# Patient Record
Sex: Female | Born: 1965
Health system: Southern US, Community
[De-identification: ages and names within clinical notes are randomized; demographics above are authoritative.]

## PROBLEM LIST (undated history)

## (undated) ENCOUNTER — Emergency Department (HOSPITAL_COMMUNITY): Payer: Medicare Other

## (undated) DIAGNOSIS — M797 Fibromyalgia: Secondary | ICD-10-CM

## (undated) DIAGNOSIS — E785 Hyperlipidemia, unspecified: Secondary | ICD-10-CM

## (undated) DIAGNOSIS — I714 Abdominal aortic aneurysm, without rupture, unspecified: Secondary | ICD-10-CM

## (undated) DIAGNOSIS — F32A Depression, unspecified: Secondary | ICD-10-CM

## (undated) DIAGNOSIS — I2510 Atherosclerotic heart disease of native coronary artery without angina pectoris: Secondary | ICD-10-CM

## (undated) DIAGNOSIS — N200 Calculus of kidney: Secondary | ICD-10-CM

## (undated) DIAGNOSIS — K219 Gastro-esophageal reflux disease without esophagitis: Secondary | ICD-10-CM

## (undated) DIAGNOSIS — K449 Diaphragmatic hernia without obstruction or gangrene: Secondary | ICD-10-CM

## (undated) DIAGNOSIS — I10 Essential (primary) hypertension: Secondary | ICD-10-CM

## (undated) DIAGNOSIS — F329 Major depressive disorder, single episode, unspecified: Secondary | ICD-10-CM

## (undated) HISTORY — DX: Hyperlipidemia, unspecified: E78.5

## (undated) HISTORY — DX: Depression, unspecified: F32.A

## (undated) HISTORY — DX: Abdominal aortic aneurysm, without rupture: I71.4

## (undated) HISTORY — DX: Major depressive disorder, single episode, unspecified: F32.9

## (undated) HISTORY — DX: Diaphragmatic hernia without obstruction or gangrene: K44.9

## (undated) HISTORY — DX: Gastro-esophageal reflux disease without esophagitis: K21.9

## (undated) HISTORY — DX: Calculus of kidney: N20.0

## (undated) HISTORY — DX: Essential (primary) hypertension: I10

## (undated) HISTORY — DX: Fibromyalgia: M79.7

## (undated) HISTORY — DX: Atherosclerotic heart disease of native coronary artery without angina pectoris: I25.10

## (undated) HISTORY — DX: Abdominal aortic aneurysm, without rupture, unspecified: I71.40

---

## 2004-12-16 ENCOUNTER — Emergency Department: Payer: Self-pay | Admitting: Emergency Medicine

## 2005-01-04 ENCOUNTER — Emergency Department: Payer: Self-pay | Admitting: Emergency Medicine

## 2005-02-25 ENCOUNTER — Inpatient Hospital Stay: Payer: Self-pay | Admitting: Internal Medicine

## 2005-07-26 ENCOUNTER — Inpatient Hospital Stay: Payer: Self-pay | Admitting: Infectious Diseases

## 2005-08-24 ENCOUNTER — Emergency Department: Payer: Self-pay | Admitting: Emergency Medicine

## 2005-08-26 ENCOUNTER — Emergency Department: Payer: Self-pay | Admitting: Emergency Medicine

## 2005-08-29 ENCOUNTER — Emergency Department: Payer: Self-pay | Admitting: Emergency Medicine

## 2005-10-01 ENCOUNTER — Emergency Department: Payer: Self-pay | Admitting: Emergency Medicine

## 2005-10-10 ENCOUNTER — Other Ambulatory Visit: Payer: Self-pay

## 2005-10-10 ENCOUNTER — Inpatient Hospital Stay: Payer: Self-pay | Admitting: Internal Medicine

## 2005-12-04 ENCOUNTER — Other Ambulatory Visit: Payer: Self-pay

## 2005-12-04 ENCOUNTER — Inpatient Hospital Stay: Payer: Self-pay | Admitting: Internal Medicine

## 2005-12-28 ENCOUNTER — Emergency Department: Payer: Self-pay | Admitting: Emergency Medicine

## 2006-01-06 ENCOUNTER — Inpatient Hospital Stay: Payer: Self-pay | Admitting: Internal Medicine

## 2006-01-06 ENCOUNTER — Other Ambulatory Visit: Payer: Self-pay

## 2006-01-07 ENCOUNTER — Other Ambulatory Visit: Payer: Self-pay

## 2006-01-08 ENCOUNTER — Other Ambulatory Visit: Payer: Self-pay

## 2006-03-19 ENCOUNTER — Emergency Department: Payer: Self-pay | Admitting: Emergency Medicine

## 2006-05-07 ENCOUNTER — Emergency Department: Payer: Self-pay | Admitting: Emergency Medicine

## 2006-05-08 ENCOUNTER — Emergency Department: Payer: Self-pay | Admitting: Unknown Physician Specialty

## 2006-08-03 ENCOUNTER — Emergency Department: Payer: Self-pay | Admitting: Emergency Medicine

## 2007-01-17 ENCOUNTER — Emergency Department: Payer: Self-pay | Admitting: Emergency Medicine

## 2007-04-17 ENCOUNTER — Emergency Department: Payer: Self-pay | Admitting: Emergency Medicine

## 2007-08-11 ENCOUNTER — Emergency Department: Payer: Self-pay | Admitting: Emergency Medicine

## 2007-09-04 ENCOUNTER — Emergency Department: Payer: Self-pay | Admitting: Emergency Medicine

## 2008-07-13 ENCOUNTER — Other Ambulatory Visit: Payer: Self-pay

## 2008-07-14 ENCOUNTER — Inpatient Hospital Stay: Payer: Self-pay | Admitting: Internal Medicine

## 2008-08-07 ENCOUNTER — Emergency Department: Payer: Self-pay | Admitting: Emergency Medicine

## 2008-11-15 ENCOUNTER — Emergency Department: Payer: Self-pay | Admitting: Emergency Medicine

## 2008-12-21 ENCOUNTER — Emergency Department: Payer: Self-pay

## 2008-12-25 ENCOUNTER — Emergency Department: Payer: Self-pay | Admitting: Emergency Medicine

## 2009-02-21 ENCOUNTER — Emergency Department: Payer: Self-pay | Admitting: Emergency Medicine

## 2009-03-25 ENCOUNTER — Emergency Department: Payer: Self-pay | Admitting: Emergency Medicine

## 2009-04-13 ENCOUNTER — Emergency Department: Payer: Self-pay | Admitting: Emergency Medicine

## 2009-05-31 ENCOUNTER — Ambulatory Visit: Payer: Self-pay | Admitting: Specialist

## 2009-07-15 ENCOUNTER — Emergency Department: Payer: Self-pay | Admitting: Emergency Medicine

## 2009-07-21 ENCOUNTER — Emergency Department: Payer: Self-pay | Admitting: Emergency Medicine

## 2009-12-08 ENCOUNTER — Emergency Department: Payer: Self-pay | Admitting: Emergency Medicine

## 2010-01-06 ENCOUNTER — Emergency Department: Payer: Self-pay | Admitting: Emergency Medicine

## 2010-05-25 ENCOUNTER — Emergency Department: Payer: Self-pay | Admitting: Emergency Medicine

## 2010-06-21 ENCOUNTER — Emergency Department: Payer: Self-pay | Admitting: Emergency Medicine

## 2010-06-28 ENCOUNTER — Inpatient Hospital Stay: Payer: Self-pay | Admitting: Internal Medicine

## 2010-07-28 ENCOUNTER — Emergency Department: Payer: Self-pay | Admitting: Emergency Medicine

## 2010-08-26 ENCOUNTER — Emergency Department: Payer: Self-pay | Admitting: Internal Medicine

## 2010-10-25 ENCOUNTER — Emergency Department: Payer: Self-pay | Admitting: Emergency Medicine

## 2010-11-13 ENCOUNTER — Emergency Department: Payer: Self-pay | Admitting: Emergency Medicine

## 2010-12-05 ENCOUNTER — Inpatient Hospital Stay: Payer: Self-pay | Admitting: Internal Medicine

## 2011-02-22 ENCOUNTER — Emergency Department: Payer: Self-pay | Admitting: Emergency Medicine

## 2011-05-31 ENCOUNTER — Inpatient Hospital Stay: Payer: Self-pay | Admitting: Internal Medicine

## 2011-07-13 ENCOUNTER — Emergency Department: Payer: Self-pay | Admitting: Emergency Medicine

## 2011-08-02 ENCOUNTER — Emergency Department: Payer: Self-pay | Admitting: *Deleted

## 2011-08-15 ENCOUNTER — Emergency Department: Payer: Self-pay | Admitting: Emergency Medicine

## 2011-09-15 ENCOUNTER — Emergency Department: Payer: Self-pay | Admitting: Emergency Medicine

## 2011-11-13 ENCOUNTER — Emergency Department: Payer: Self-pay | Admitting: Emergency Medicine

## 2011-11-14 ENCOUNTER — Emergency Department: Payer: Self-pay | Admitting: Emergency Medicine

## 2011-11-23 ENCOUNTER — Emergency Department: Payer: Self-pay | Admitting: Emergency Medicine

## 2011-11-23 LAB — URINALYSIS, COMPLETE
Bacteria: NONE SEEN
Glucose,UR: NEGATIVE mg/dL (ref 0–75)
Ketone: NEGATIVE
Leukocyte Esterase: NEGATIVE
Specific Gravity: 1.006 (ref 1.003–1.030)
WBC UR: <1 /HPF (ref 0–5)

## 2011-11-23 LAB — COMPREHENSIVE METABOLIC PANEL
Alkaline Phosphatase: 73 U/L (ref 50–136)
Bilirubin,Total: 0.2 mg/dL (ref 0.2–1.0)
Calcium, Total: 9.2 mg/dL (ref 8.5–10.1)
Chloride: 107 mmol/L (ref 98–107)
Co2: 25 mmol/L (ref 21–32)
Creatinine: 0.74 mg/dL (ref 0.60–1.30)
EGFR (African American): >60
EGFR (Non-African Amer.): >60
SGOT(AST): 25 U/L (ref 15–37)
SGPT (ALT): 21 U/L
Sodium: 141 mmol/L (ref 136–145)

## 2011-11-23 LAB — CBC
MCH: 29.1 pg (ref 26.0–34.0)
MCHC: 33.5 g/dL (ref 32.0–36.0)
MCV: 87 fL (ref 80–100)
Platelet: 286 10*3/uL (ref 150–440)
RBC: 5.16 10*6/uL (ref 3.80–5.20)

## 2011-11-23 LAB — LIPASE, BLOOD: Lipase: 118 U/L (ref 73–393)

## 2011-11-23 LAB — PREGNANCY, URINE: Pregnancy Test, Urine: NEGATIVE m[IU]/mL

## 2011-12-11 ENCOUNTER — Ambulatory Visit: Payer: Self-pay | Admitting: Urology

## 2011-12-18 ENCOUNTER — Ambulatory Visit: Payer: Self-pay | Admitting: Urology

## 2011-12-20 ENCOUNTER — Ambulatory Visit: Payer: Self-pay | Admitting: Urology

## 2012-01-11 ENCOUNTER — Emergency Department: Payer: Self-pay | Admitting: Emergency Medicine

## 2012-02-18 ENCOUNTER — Emergency Department: Payer: Self-pay | Admitting: Emergency Medicine

## 2012-02-18 LAB — CBC
HCT: 40.4 % (ref 35.0–47.0)
MCHC: 33.3 g/dL (ref 32.0–36.0)
MCV: 88 fL (ref 80–100)
Platelet: 276 10*3/uL (ref 150–440)
RBC: 4.59 10*6/uL (ref 3.80–5.20)

## 2012-02-18 LAB — COMPREHENSIVE METABOLIC PANEL
Albumin: 4 g/dL (ref 3.4–5.0)
Alkaline Phosphatase: 81 U/L (ref 50–136)
Anion Gap: 10 (ref 7–16)
BUN: 15 mg/dL (ref 7–18)
Bilirubin,Total: 0.3 mg/dL (ref 0.2–1.0)
Calcium, Total: 9.2 mg/dL (ref 8.5–10.1)
Chloride: 108 mmol/L — ABNORMAL HIGH (ref 98–107)
EGFR (African American): >60
EGFR (Non-African Amer.): >60
Glucose: 91 mg/dL (ref 65–99)
Osmolality: 284 (ref 275–301)
SGOT(AST): 19 U/L (ref 15–37)
SGPT (ALT): 7 U/L — ABNORMAL LOW
Sodium: 142 mmol/L (ref 136–145)
Total Protein: 7.4 g/dL (ref 6.4–8.2)

## 2012-02-18 LAB — LIPASE, BLOOD: Lipase: 145 U/L (ref 73–393)

## 2012-02-18 LAB — URINALYSIS, COMPLETE
Bilirubin,UR: NEGATIVE
Glucose,UR: NEGATIVE mg/dL (ref 0–75)
Leukocyte Esterase: NEGATIVE
Nitrite: NEGATIVE
Ph: 5 (ref 4.5–8.0)
Protein: NEGATIVE
Squamous Epithelial: 3
WBC UR: 3 /HPF (ref 0–5)

## 2012-03-19 ENCOUNTER — Observation Stay: Payer: Self-pay | Admitting: Internal Medicine

## 2012-03-19 LAB — COMPREHENSIVE METABOLIC PANEL
Albumin: 4.2 g/dL (ref 3.4–5.0)
Anion Gap: 7 (ref 7–16)
BUN: 6 mg/dL — ABNORMAL LOW (ref 7–18)
Bilirubin,Total: 0.2 mg/dL (ref 0.2–1.0)
Calcium, Total: 9.1 mg/dL (ref 8.5–10.1)
Chloride: 111 mmol/L — ABNORMAL HIGH (ref 98–107)
Co2: 22 mmol/L (ref 21–32)
Creatinine: 0.58 mg/dL — ABNORMAL LOW (ref 0.60–1.30)
EGFR (African American): >60
Glucose: 97 mg/dL (ref 65–99)
Osmolality: 277 (ref 275–301)
Potassium: 3.8 mmol/L (ref 3.5–5.1)
SGOT(AST): 18 U/L (ref 15–37)
SGPT (ALT): 16 U/L

## 2012-03-19 LAB — CK TOTAL AND CKMB (NOT AT ARMC)
CK, Total: 84 U/L (ref 21–215)
CK-MB: 0.8 ng/mL (ref 0.5–3.6)

## 2012-03-19 LAB — CBC
MCV: 88 fL (ref 80–100)
RBC: 5.06 10*6/uL (ref 3.80–5.20)
RDW: 16.3 % — ABNORMAL HIGH (ref 11.5–14.5)

## 2012-03-19 LAB — TROPONIN I: Troponin-I: <0.02 ng/mL

## 2012-03-19 LAB — CK-MB: CK-MB: <0.5 ng/mL — ABNORMAL LOW (ref 0.5–3.6)

## 2012-03-20 LAB — BASIC METABOLIC PANEL
BUN: 10 mg/dL (ref 7–18)
Calcium, Total: 9 mg/dL (ref 8.5–10.1)
Chloride: 107 mmol/L (ref 98–107)
Creatinine: 0.86 mg/dL (ref 0.60–1.30)
EGFR (Non-African Amer.): >60
Glucose: 89 mg/dL (ref 65–99)
Osmolality: 274 (ref 275–301)
Sodium: 138 mmol/L (ref 136–145)

## 2012-03-20 LAB — CBC WITH DIFFERENTIAL/PLATELET
Basophil #: 0.1 10*3/uL (ref 0.0–0.1)
Lymphocyte %: 46 %
Monocyte #: 0.5 x10 3/mm (ref 0.2–0.9)
Neutrophil #: 2.8 10*3/uL (ref 1.4–6.5)
Platelet: 180 10*3/uL (ref 150–440)
RDW: 15.8 % — ABNORMAL HIGH (ref 11.5–14.5)
WBC: 6.7 10*3/uL (ref 3.6–11.0)

## 2012-03-20 LAB — TROPONIN I: Troponin-I: <0.02 ng/mL

## 2012-04-03 ENCOUNTER — Emergency Department: Payer: Self-pay | Admitting: *Deleted

## 2012-04-03 LAB — CBC
HCT: 40.4 % (ref 35.0–47.0)
HGB: 13 g/dL (ref 12.0–16.0)
MCH: 28.7 pg (ref 26.0–34.0)
MCHC: 32.2 g/dL (ref 32.0–36.0)
MCV: 89 fL (ref 80–100)
Platelet: 265 10*3/uL (ref 150–440)
RBC: 4.54 10*6/uL (ref 3.80–5.20)
RDW: 15.4 % — ABNORMAL HIGH (ref 11.5–14.5)

## 2012-04-03 LAB — URINALYSIS, COMPLETE
Bilirubin,UR: NEGATIVE
Glucose,UR: NEGATIVE mg/dL (ref 0–75)
Nitrite: NEGATIVE
Protein: NEGATIVE
Specific Gravity: 1.01 (ref 1.003–1.030)
Squamous Epithelial: 1

## 2012-04-03 LAB — COMPREHENSIVE METABOLIC PANEL
Albumin: 3.9 g/dL (ref 3.4–5.0)
BUN: 11 mg/dL (ref 7–18)
Bilirubin,Total: 0.3 mg/dL (ref 0.2–1.0)
Calcium, Total: 8.9 mg/dL (ref 8.5–10.1)
Co2: 26 mmol/L (ref 21–32)
EGFR (African American): >60
EGFR (Non-African Amer.): >60
Glucose: 89 mg/dL (ref 65–99)
Potassium: 3.9 mmol/L (ref 3.5–5.1)
Total Protein: 7.3 g/dL (ref 6.4–8.2)

## 2012-04-03 LAB — LIPASE, BLOOD: Lipase: 160 U/L (ref 73–393)

## 2012-05-02 ENCOUNTER — Emergency Department: Payer: Self-pay | Admitting: Emergency Medicine

## 2012-05-02 LAB — COMPREHENSIVE METABOLIC PANEL
Albumin: 4.4 g/dL (ref 3.4–5.0)
Anion Gap: 8 (ref 7–16)
BUN: 5 mg/dL — ABNORMAL LOW (ref 7–18)
Bilirubin,Total: 0.4 mg/dL (ref 0.2–1.0)
Calcium, Total: 9.8 mg/dL (ref 8.5–10.1)
EGFR (Non-African Amer.): >60
Osmolality: 273 (ref 275–301)
Potassium: 3.5 mmol/L (ref 3.5–5.1)
SGOT(AST): 17 U/L (ref 15–37)
Sodium: 138 mmol/L (ref 136–145)
Total Protein: 8.1 g/dL (ref 6.4–8.2)

## 2012-05-02 LAB — CBC
HCT: 45.5 % (ref 35.0–47.0)
MCV: 87 fL (ref 80–100)
Platelet: 291 10*3/uL (ref 150–440)
RBC: 5.25 10*6/uL — ABNORMAL HIGH (ref 3.80–5.20)
RDW: 15.5 % — ABNORMAL HIGH (ref 11.5–14.5)
WBC: 16.4 10*3/uL — ABNORMAL HIGH (ref 3.6–11.0)

## 2012-05-02 LAB — URINALYSIS, COMPLETE
Leukocyte Esterase: NEGATIVE
Nitrite: NEGATIVE
Ph: 6 (ref 4.5–8.0)
RBC,UR: 4 /HPF (ref 0–5)
Specific Gravity: 1.005 (ref 1.003–1.030)

## 2012-05-10 ENCOUNTER — Emergency Department: Payer: Self-pay | Admitting: Emergency Medicine

## 2012-06-10 LAB — COMPREHENSIVE METABOLIC PANEL
Albumin: 4.1 g/dL (ref 3.4–5.0)
Alkaline Phosphatase: 93 U/L (ref 50–136)
BUN: 6 mg/dL — ABNORMAL LOW (ref 7–18)
Bilirubin,Total: 0.3 mg/dL (ref 0.2–1.0)
Calcium, Total: 9.6 mg/dL (ref 8.5–10.1)
Creatinine: 0.71 mg/dL (ref 0.60–1.30)
EGFR (African American): >60
Glucose: 96 mg/dL (ref 65–99)
Osmolality: 271 (ref 275–301)
Potassium: 3.4 mmol/L — ABNORMAL LOW (ref 3.5–5.1)
Sodium: 137 mmol/L (ref 136–145)
Total Protein: 7.8 g/dL (ref 6.4–8.2)

## 2012-06-10 LAB — URINALYSIS, COMPLETE
Glucose,UR: NEGATIVE mg/dL (ref 0–75)
Leukocyte Esterase: NEGATIVE
Nitrite: NEGATIVE
Ph: 6 (ref 4.5–8.0)
Protein: NEGATIVE
Specific Gravity: 1.005 (ref 1.003–1.030)

## 2012-06-10 LAB — CBC
HCT: 45.9 % (ref 35.0–47.0)
MCHC: 31.4 g/dL — ABNORMAL LOW (ref 32.0–36.0)
Platelet: 348 10*3/uL (ref 150–440)
RBC: 5.28 10*6/uL — ABNORMAL HIGH (ref 3.80–5.20)
WBC: 14.3 10*3/uL — ABNORMAL HIGH (ref 3.6–11.0)

## 2012-06-11 ENCOUNTER — Observation Stay: Payer: Self-pay | Admitting: Internal Medicine

## 2012-06-11 LAB — TROPONIN I
Troponin-I: <0.02 ng/mL
Troponin-I: <0.02 ng/mL

## 2012-06-11 LAB — CLOSTRIDIUM DIFFICILE BY PCR

## 2012-06-17 LAB — CULTURE, BLOOD (SINGLE)

## 2012-06-26 ENCOUNTER — Emergency Department: Payer: Self-pay | Admitting: Emergency Medicine

## 2012-06-26 LAB — COMPREHENSIVE METABOLIC PANEL
Albumin: 4.2 g/dL (ref 3.4–5.0)
Alkaline Phosphatase: 92 U/L (ref 50–136)
BUN: 5 mg/dL — ABNORMAL LOW (ref 7–18)
Bilirubin,Total: 0.3 mg/dL (ref 0.2–1.0)
Chloride: 109 mmol/L — ABNORMAL HIGH (ref 98–107)
Creatinine: 0.71 mg/dL (ref 0.60–1.30)
EGFR (African American): >60
EGFR (Non-African Amer.): >60
Glucose: 94 mg/dL (ref 65–99)
Potassium: 3.9 mmol/L (ref 3.5–5.1)
SGOT(AST): 20 U/L (ref 15–37)
SGPT (ALT): 11 U/L — ABNORMAL LOW (ref 12–78)
Sodium: 140 mmol/L (ref 136–145)
Total Protein: 8.3 g/dL — ABNORMAL HIGH (ref 6.4–8.2)

## 2012-06-26 LAB — URINALYSIS, COMPLETE
Bacteria: NONE SEEN
Bilirubin,UR: NEGATIVE
Blood: NEGATIVE
Glucose,UR: NEGATIVE mg/dL (ref 0–75)
Nitrite: NEGATIVE
Protein: NEGATIVE
Specific Gravity: 1.012 (ref 1.003–1.030)
Squamous Epithelial: 2
Transitional Epi: <1
WBC UR: 1 /HPF (ref 0–5)

## 2012-06-26 LAB — CBC
HCT: 47 % (ref 35.0–47.0)
MCHC: 33 g/dL (ref 32.0–36.0)
Platelet: 302 10*3/uL (ref 150–440)
RDW: 16.9 % — ABNORMAL HIGH (ref 11.5–14.5)
WBC: 10.1 10*3/uL (ref 3.6–11.0)

## 2012-06-26 LAB — LIPASE, BLOOD: Lipase: 111 U/L (ref 73–393)

## 2012-07-09 ENCOUNTER — Observation Stay: Payer: Self-pay | Admitting: Internal Medicine

## 2012-07-09 LAB — TROPONIN I
Troponin-I: <0.02 ng/mL
Troponin-I: <0.02 ng/mL

## 2012-07-09 LAB — CK TOTAL AND CKMB (NOT AT ARMC)
CK, Total: 74 U/L (ref 21–215)
CK-MB: 0.7 ng/mL (ref 0.5–3.6)
CK-MB: 0.5 ng/mL (ref 0.5–3.6)

## 2012-07-09 LAB — BASIC METABOLIC PANEL
Anion Gap: 6 — ABNORMAL LOW (ref 7–16)
Calcium, Total: 9.2 mg/dL (ref 8.5–10.1)
Chloride: 108 mmol/L — ABNORMAL HIGH (ref 98–107)
Creatinine: 0.62 mg/dL (ref 0.60–1.30)
EGFR (African American): >60
EGFR (Non-African Amer.): >60
Glucose: 90 mg/dL (ref 65–99)
Potassium: 3.8 mmol/L (ref 3.5–5.1)
Sodium: 139 mmol/L (ref 136–145)

## 2012-07-09 LAB — CBC
HGB: 13.6 g/dL (ref 12.0–16.0)
MCHC: 32.6 g/dL (ref 32.0–36.0)
RBC: 4.79 10*6/uL (ref 3.80–5.20)
RDW: 17.2 % — ABNORMAL HIGH (ref 11.5–14.5)
WBC: 12 10*3/uL — ABNORMAL HIGH (ref 3.6–11.0)

## 2012-07-10 LAB — CK TOTAL AND CKMB (NOT AT ARMC)
CK, Total: 48 U/L (ref 21–215)
CK-MB: 0.5 ng/mL (ref 0.5–3.6)

## 2012-08-04 ENCOUNTER — Emergency Department: Payer: Self-pay | Admitting: Internal Medicine

## 2012-08-24 ENCOUNTER — Emergency Department: Payer: Self-pay | Admitting: Emergency Medicine

## 2012-10-04 ENCOUNTER — Other Ambulatory Visit: Payer: Self-pay | Admitting: Pain Medicine

## 2012-10-04 ENCOUNTER — Ambulatory Visit: Payer: Self-pay | Admitting: Pain Medicine

## 2012-10-04 LAB — SEDIMENTATION RATE: Erythrocyte Sed Rate: 2 mm/hr (ref 0–20)

## 2012-10-30 ENCOUNTER — Emergency Department: Payer: Self-pay | Admitting: Internal Medicine

## 2012-10-30 LAB — HEPATIC FUNCTION PANEL A (ARMC)
Albumin: 3.9 g/dL (ref 3.4–5.0)
Bilirubin, Direct: <0.1 mg/dL (ref 0.00–0.20)
Bilirubin,Total: 0.4 mg/dL (ref 0.2–1.0)
SGOT(AST): 16 U/L (ref 15–37)
Total Protein: 7.8 g/dL (ref 6.4–8.2)

## 2012-10-30 LAB — CBC
HCT: 43.2 % (ref 35.0–47.0)
HGB: 13.9 g/dL (ref 12.0–16.0)
MCHC: 32.3 g/dL (ref 32.0–36.0)
Platelet: 301 10*3/uL (ref 150–440)
RBC: 4.9 10*6/uL (ref 3.80–5.20)

## 2012-10-30 LAB — BASIC METABOLIC PANEL
Calcium, Total: 9.1 mg/dL (ref 8.5–10.1)
Chloride: 107 mmol/L (ref 98–107)
Co2: 21 mmol/L (ref 21–32)
Glucose: 96 mg/dL (ref 65–99)
Osmolality: 271 (ref 275–301)
Potassium: 3.6 mmol/L (ref 3.5–5.1)
Sodium: 137 mmol/L (ref 136–145)

## 2012-10-30 LAB — LIPASE, BLOOD: Lipase: 85 U/L (ref 73–393)

## 2012-10-30 LAB — CK TOTAL AND CKMB (NOT AT ARMC)
CK, Total: 72 U/L (ref 21–215)
CK-MB: 0.5 ng/mL (ref 0.5–3.6)

## 2012-11-13 HISTORY — PX: CARDIAC CATHETERIZATION: SHX172

## 2012-11-18 ENCOUNTER — Ambulatory Visit: Payer: Self-pay | Admitting: Pain Medicine

## 2012-11-22 ENCOUNTER — Observation Stay: Payer: Self-pay | Admitting: Internal Medicine

## 2012-11-22 LAB — CBC
HGB: 14.6 g/dL (ref 12.0–16.0)
MCHC: 31.8 g/dL — ABNORMAL LOW (ref 32.0–36.0)
Platelet: 230 10*3/uL (ref 150–440)
RBC: 5.2 10*6/uL (ref 3.80–5.20)
RDW: 16.8 % — ABNORMAL HIGH (ref 11.5–14.5)
WBC: 10.6 10*3/uL (ref 3.6–11.0)

## 2012-11-22 LAB — DRUG SCREEN, URINE
Amphetamines, Ur Screen: NEGATIVE (ref ?–1000)
Barbiturates, Ur Screen: NEGATIVE (ref ?–200)
Benzodiazepine, Ur Scrn: NEGATIVE (ref ?–200)
Cannabinoid 50 Ng, Ur ~~LOC~~: NEGATIVE (ref ?–50)
Cocaine Metabolite,Ur ~~LOC~~: NEGATIVE (ref ?–300)
MDMA (Ecstasy)Ur Screen: NEGATIVE (ref ?–500)
Phencyclidine (PCP) Ur S: NEGATIVE (ref ?–25)

## 2012-11-22 LAB — COMPREHENSIVE METABOLIC PANEL
Anion Gap: 5 — ABNORMAL LOW (ref 7–16)
Bilirubin,Total: 0.3 mg/dL (ref 0.2–1.0)
Calcium, Total: 9.6 mg/dL (ref 8.5–10.1)
Co2: 27 mmol/L (ref 21–32)
Creatinine: 0.67 mg/dL (ref 0.60–1.30)
Glucose: 84 mg/dL (ref 65–99)
Osmolality: 275 (ref 275–301)
Potassium: 3.9 mmol/L (ref 3.5–5.1)
SGOT(AST): 22 U/L (ref 15–37)
Sodium: 139 mmol/L (ref 136–145)

## 2012-11-22 LAB — LIPASE, BLOOD: Lipase: 109 U/L (ref 73–393)

## 2012-11-22 LAB — CK TOTAL AND CKMB (NOT AT ARMC): CK, Total: 73 U/L (ref 21–215)

## 2012-11-22 LAB — TROPONIN I: Troponin-I: <0.02 ng/mL

## 2012-11-23 LAB — BASIC METABOLIC PANEL
BUN: 11 mg/dL (ref 7–18)
Chloride: 108 mmol/L — ABNORMAL HIGH (ref 98–107)
EGFR (Non-African Amer.): >60
Osmolality: 278 (ref 275–301)
Sodium: 140 mmol/L (ref 136–145)

## 2012-11-23 LAB — CBC WITH DIFFERENTIAL/PLATELET
Eosinophil %: 3.3 %
Lymphocyte %: 37.1 %
MCHC: 32.3 g/dL (ref 32.0–36.0)
MCV: 90 fL (ref 80–100)
Monocyte #: 0.9 x10 3/mm (ref 0.2–0.9)
Neutrophil #: 5.5 10*3/uL (ref 1.4–6.5)
Platelet: 199 10*3/uL (ref 150–440)
RBC: 4.51 10*6/uL (ref 3.80–5.20)
WBC: 10.9 10*3/uL (ref 3.6–11.0)

## 2012-11-23 LAB — CK TOTAL AND CKMB (NOT AT ARMC): CK, Total: 48 U/L (ref 21–215)

## 2012-11-23 LAB — TROPONIN I
Troponin-I: <0.02 ng/mL
Troponin-I: <0.02 ng/mL

## 2012-12-28 ENCOUNTER — Inpatient Hospital Stay: Payer: Self-pay | Admitting: Internal Medicine

## 2012-12-28 LAB — URINALYSIS, COMPLETE
Leukocyte Esterase: NEGATIVE
Protein: 30
RBC,UR: 4 /HPF (ref 0–5)
Squamous Epithelial: 14

## 2012-12-28 LAB — COMPREHENSIVE METABOLIC PANEL
Alkaline Phosphatase: 100 U/L (ref 50–136)
Anion Gap: 8 (ref 7–16)
BUN: 6 mg/dL — ABNORMAL LOW (ref 7–18)
Bilirubin,Total: 0.5 mg/dL (ref 0.2–1.0)
Calcium, Total: 9.8 mg/dL (ref 8.5–10.1)
Chloride: 110 mmol/L — ABNORMAL HIGH (ref 98–107)
EGFR (African American): >60
EGFR (Non-African Amer.): >60
Glucose: 104 mg/dL — ABNORMAL HIGH (ref 65–99)
Osmolality: 275 (ref 275–301)
SGPT (ALT): 9 U/L — ABNORMAL LOW (ref 12–78)
Sodium: 139 mmol/L (ref 136–145)

## 2012-12-28 LAB — HEMOGLOBIN: HGB: 13 g/dL (ref 12.0–16.0)

## 2012-12-28 LAB — CBC
HGB: 15.6 g/dL (ref 12.0–16.0)
MCH: 29.1 pg (ref 26.0–34.0)
MCHC: 33.2 g/dL (ref 32.0–36.0)
MCV: 88 fL (ref 80–100)
Platelet: 290 10*3/uL (ref 150–440)
RBC: 5.37 10*6/uL — ABNORMAL HIGH (ref 3.80–5.20)
RDW: 16.1 % — ABNORMAL HIGH (ref 11.5–14.5)

## 2012-12-28 LAB — LIPASE, BLOOD: Lipase: 123 U/L (ref 73–393)

## 2013-01-07 ENCOUNTER — Emergency Department: Payer: Self-pay | Admitting: Unknown Physician Specialty

## 2013-01-07 LAB — CBC
HCT: 43.3 % (ref 35.0–47.0)
HGB: 14.2 g/dL (ref 12.0–16.0)
MCH: 28.9 pg (ref 26.0–34.0)
MCV: 88 fL (ref 80–100)
Platelet: 263 10*3/uL (ref 150–440)
RBC: 4.93 10*6/uL (ref 3.80–5.20)

## 2013-01-07 LAB — CK TOTAL AND CKMB (NOT AT ARMC)
CK, Total: 76 U/L (ref 21–215)
CK-MB: <0.5 ng/mL — ABNORMAL LOW (ref 0.5–3.6)

## 2013-01-07 LAB — COMPREHENSIVE METABOLIC PANEL
Alkaline Phosphatase: 105 U/L (ref 50–136)
BUN: 7 mg/dL (ref 7–18)
Bilirubin,Total: 0.3 mg/dL (ref 0.2–1.0)
Calcium, Total: 9.1 mg/dL (ref 8.5–10.1)
Chloride: 111 mmol/L — ABNORMAL HIGH (ref 98–107)
Co2: 23 mmol/L (ref 21–32)
Creatinine: 0.57 mg/dL — ABNORMAL LOW (ref 0.60–1.30)
EGFR (Non-African Amer.): >60
Glucose: 91 mg/dL (ref 65–99)
Osmolality: 277 (ref 275–301)
Potassium: 3.8 mmol/L (ref 3.5–5.1)
Sodium: 140 mmol/L (ref 136–145)
Total Protein: 8 g/dL (ref 6.4–8.2)

## 2013-01-07 LAB — MAGNESIUM: Magnesium: 2.2 mg/dL

## 2013-01-15 ENCOUNTER — Ambulatory Visit: Payer: Self-pay | Admitting: Pain Medicine

## 2013-02-05 LAB — CBC
HCT: 41.7 % (ref 35.0–47.0)
HGB: 13.7 g/dL (ref 12.0–16.0)
Platelet: 342 10*3/uL (ref 150–440)
RBC: 4.78 10*6/uL (ref 3.80–5.20)
RDW: 15.3 % — ABNORMAL HIGH (ref 11.5–14.5)
WBC: 17.7 10*3/uL — ABNORMAL HIGH (ref 3.6–11.0)

## 2013-02-05 LAB — BASIC METABOLIC PANEL
BUN: 7 mg/dL (ref 7–18)
Chloride: 110 mmol/L — ABNORMAL HIGH (ref 98–107)
Co2: 25 mmol/L (ref 21–32)
Creatinine: 0.55 mg/dL — ABNORMAL LOW (ref 0.60–1.30)
EGFR (African American): >60
EGFR (Non-African Amer.): >60
Glucose: 110 mg/dL — ABNORMAL HIGH (ref 65–99)
Sodium: 142 mmol/L (ref 136–145)

## 2013-02-05 LAB — APTT: Activated PTT: 33.6 secs (ref 23.6–35.9)

## 2013-02-06 ENCOUNTER — Inpatient Hospital Stay: Payer: Self-pay | Admitting: Internal Medicine

## 2013-02-06 DIAGNOSIS — I200 Unstable angina: Secondary | ICD-10-CM

## 2013-02-06 DIAGNOSIS — I2510 Atherosclerotic heart disease of native coronary artery without angina pectoris: Secondary | ICD-10-CM

## 2013-02-06 LAB — CK TOTAL AND CKMB (NOT AT ARMC)
CK, Total: 58 U/L (ref 21–215)
CK, Total: 38 U/L (ref 21–215)
CK, Total: 45 U/L (ref 21–215)
CK-MB: 0.7 ng/mL (ref 0.5–3.6)
CK-MB: 0.8 ng/mL (ref 0.5–3.6)

## 2013-02-06 LAB — URINALYSIS, COMPLETE
Bacteria: NONE SEEN
Bilirubin,UR: NEGATIVE
Leukocyte Esterase: NEGATIVE
Nitrite: NEGATIVE
Ph: 6 (ref 4.5–8.0)
RBC,UR: 17 /HPF (ref 0–5)
Specific Gravity: 1.012 (ref 1.003–1.030)
Squamous Epithelial: <1
WBC UR: 2 /HPF (ref 0–5)

## 2013-02-06 LAB — TROPONIN I
Troponin-I: <0.02 ng/mL
Troponin-I: <0.02 ng/mL
Troponin-I: <0.02 ng/mL

## 2013-02-06 LAB — APTT
Activated PTT: 127.6 secs — ABNORMAL HIGH (ref 23.6–35.9)
Activated PTT: 75.5 secs — ABNORMAL HIGH (ref 23.6–35.9)

## 2013-02-06 LAB — POTASSIUM: Potassium: 3.4 mmol/L — ABNORMAL LOW (ref 3.5–5.1)

## 2013-02-07 DIAGNOSIS — I200 Unstable angina: Secondary | ICD-10-CM

## 2013-02-07 LAB — CBC WITH DIFFERENTIAL/PLATELET
Basophil #: 0.1 10*3/uL (ref 0.0–0.1)
Basophil %: 0.7 %
Eosinophil #: 0.4 10*3/uL (ref 0.0–0.7)
Eosinophil %: 3.9 %
HCT: 34.3 % — ABNORMAL LOW (ref 35.0–47.0)
HGB: 11.1 g/dL — ABNORMAL LOW (ref 12.0–16.0)
Lymphocyte #: 2.3 10*3/uL (ref 1.0–3.6)
Lymphocyte %: 23.9 %
MCH: 28.6 pg (ref 26.0–34.0)
MCV: 88 fL (ref 80–100)
Monocyte %: 5.9 %
Neutrophil %: 65.6 %
Platelet: 262 10*3/uL (ref 150–440)
RBC: 3.89 10*6/uL (ref 3.80–5.20)
RDW: 15.3 % — ABNORMAL HIGH (ref 11.5–14.5)

## 2013-02-07 LAB — LIPID PANEL
Cholesterol: 147 mg/dL (ref 0–200)
Ldl Cholesterol, Calc: 73 mg/dL (ref 0–100)
Triglycerides: 126 mg/dL (ref 0–200)
VLDL Cholesterol, Calc: 25 mg/dL (ref 5–40)

## 2013-02-07 LAB — BASIC METABOLIC PANEL
Anion Gap: 4 — ABNORMAL LOW (ref 7–16)
BUN: 6 mg/dL — ABNORMAL LOW (ref 7–18)
Calcium, Total: 8.3 mg/dL — ABNORMAL LOW (ref 8.5–10.1)
Co2: 25 mmol/L (ref 21–32)
Creatinine: 0.59 mg/dL — ABNORMAL LOW (ref 0.60–1.30)
EGFR (African American): >60
EGFR (Non-African Amer.): >60
Glucose: 119 mg/dL — ABNORMAL HIGH (ref 65–99)
Osmolality: 274 (ref 275–301)
Sodium: 138 mmol/L (ref 136–145)

## 2013-02-10 ENCOUNTER — Encounter: Payer: Self-pay | Admitting: *Deleted

## 2013-02-11 ENCOUNTER — Encounter: Payer: Self-pay | Admitting: Cardiovascular Disease

## 2013-02-11 ENCOUNTER — Encounter: Payer: Self-pay | Admitting: *Deleted

## 2013-03-10 ENCOUNTER — Emergency Department: Payer: Self-pay | Admitting: Emergency Medicine

## 2013-03-10 LAB — URINALYSIS, COMPLETE
Glucose,UR: NEGATIVE mg/dL (ref 0–75)
Leukocyte Esterase: NEGATIVE
Nitrite: NEGATIVE

## 2013-03-10 LAB — BASIC METABOLIC PANEL
Anion Gap: 9 (ref 7–16)
Calcium, Total: 9.4 mg/dL (ref 8.5–10.1)
Osmolality: 277 (ref 275–301)

## 2013-03-10 LAB — CBC
HCT: 38.8 % (ref 35.0–47.0)
HGB: 13.1 g/dL (ref 12.0–16.0)
Platelet: 270 10*3/uL (ref 150–440)
RBC: 4.5 10*6/uL (ref 3.80–5.20)

## 2013-04-08 LAB — CBC
HCT: 41.1 % (ref 35.0–47.0)
HGB: 13.9 g/dL (ref 12.0–16.0)
MCH: 28.5 pg (ref 26.0–34.0)
MCHC: 33.7 g/dL (ref 32.0–36.0)
MCV: 85 fL (ref 80–100)
Platelet: 330 10*3/uL (ref 150–440)
RBC: 4.87 10*6/uL (ref 3.80–5.20)
RDW: 15.6 % — ABNORMAL HIGH (ref 11.5–14.5)
WBC: 9.9 10*3/uL (ref 3.6–11.0)

## 2013-04-08 LAB — URINALYSIS, COMPLETE
Bacteria: NONE SEEN
Bilirubin,UR: NEGATIVE
Glucose,UR: NEGATIVE mg/dL (ref 0–75)
Ketone: NEGATIVE
Leukocyte Esterase: NEGATIVE
Nitrite: NEGATIVE
Ph: 6 (ref 4.5–8.0)
Protein: NEGATIVE
RBC,UR: 6 /HPF (ref 0–5)
Specific Gravity: 1.01 (ref 1.003–1.030)
Squamous Epithelial: 5
WBC UR: 1 /HPF (ref 0–5)

## 2013-04-08 LAB — COMPREHENSIVE METABOLIC PANEL
Albumin: 4.3 g/dL (ref 3.4–5.0)
Anion Gap: 6 — ABNORMAL LOW (ref 7–16)
BUN: 3 mg/dL — ABNORMAL LOW (ref 7–18)
Calcium, Total: 10 mg/dL (ref 8.5–10.1)
Chloride: 107 mmol/L (ref 98–107)
Co2: 25 mmol/L (ref 21–32)
EGFR (Non-African Amer.): >60
Glucose: 93 mg/dL (ref 65–99)
Osmolality: 272 (ref 275–301)
Potassium: 3.4 mmol/L — ABNORMAL LOW (ref 3.5–5.1)
SGPT (ALT): 18 U/L (ref 12–78)

## 2013-04-08 LAB — LIPASE, BLOOD: Lipase: 127 U/L (ref 73–393)

## 2013-04-09 ENCOUNTER — Inpatient Hospital Stay: Payer: Self-pay | Admitting: Specialist

## 2013-04-11 LAB — CBC WITH DIFFERENTIAL/PLATELET
Basophil #: 0.1 10*3/uL (ref 0.0–0.1)
Basophil %: 1 %
Eosinophil #: 0.4 10*3/uL (ref 0.0–0.7)
Lymphocyte %: 32.8 %
MCH: 28.4 pg (ref 26.0–34.0)
MCHC: 33.7 g/dL (ref 32.0–36.0)
MCV: 84 fL (ref 80–100)
Monocyte #: 0.4 x10 3/mm (ref 0.2–0.9)
Monocyte %: 6.1 %
Neutrophil #: 4 10*3/uL (ref 1.4–6.5)
RBC: 3.89 10*6/uL (ref 3.80–5.20)
WBC: 7.3 10*3/uL (ref 3.6–11.0)

## 2013-04-11 LAB — COMPREHENSIVE METABOLIC PANEL
Albumin: 3.1 g/dL — ABNORMAL LOW (ref 3.4–5.0)
Alkaline Phosphatase: 67 U/L (ref 50–136)
Anion Gap: 4 — ABNORMAL LOW (ref 7–16)
BUN: 7 mg/dL (ref 7–18)
Bilirubin,Total: 0.5 mg/dL (ref 0.2–1.0)
Chloride: 113 mmol/L — ABNORMAL HIGH (ref 98–107)
Co2: 25 mmol/L (ref 21–32)
EGFR (African American): >60
EGFR (Non-African Amer.): >60
Glucose: 98 mg/dL (ref 65–99)
Osmolality: 281 (ref 275–301)
Potassium: 3.2 mmol/L — ABNORMAL LOW (ref 3.5–5.1)
SGOT(AST): 16 U/L (ref 15–37)
SGPT (ALT): 14 U/L (ref 12–78)
Sodium: 142 mmol/L (ref 136–145)

## 2013-05-05 ENCOUNTER — Observation Stay: Payer: Self-pay | Admitting: Student

## 2013-05-05 DIAGNOSIS — R079 Chest pain, unspecified: Secondary | ICD-10-CM

## 2013-05-05 LAB — BASIC METABOLIC PANEL
Anion Gap: 6 — ABNORMAL LOW (ref 7–16)
BUN: 6 mg/dL — ABNORMAL LOW (ref 7–18)
Calcium, Total: 9.5 mg/dL (ref 8.5–10.1)
Chloride: 110 mmol/L — ABNORMAL HIGH (ref 98–107)
Co2: 25 mmol/L (ref 21–32)
Creatinine: 0.72 mg/dL (ref 0.60–1.30)
EGFR (African American): >60
EGFR (Non-African Amer.): >60
Glucose: 93 mg/dL (ref 65–99)
Osmolality: 279 (ref 275–301)
Sodium: 141 mmol/L (ref 136–145)

## 2013-05-05 LAB — CBC
HCT: 40.9 % (ref 35.0–47.0)
HGB: 13.8 g/dL (ref 12.0–16.0)
MCHC: 33.6 g/dL (ref 32.0–36.0)
MCV: 84 fL (ref 80–100)
RBC: 4.85 10*6/uL (ref 3.80–5.20)
RDW: 15.8 % — ABNORMAL HIGH (ref 11.5–14.5)
WBC: 10.5 10*3/uL (ref 3.6–11.0)

## 2013-05-05 LAB — CK TOTAL AND CKMB (NOT AT ARMC): CK-MB: <0.5 ng/mL — ABNORMAL LOW (ref 0.5–3.6)

## 2013-05-05 LAB — PROTIME-INR
INR: 0.9
Prothrombin Time: 12.4 secs (ref 11.5–14.7)

## 2013-05-06 DIAGNOSIS — I200 Unstable angina: Secondary | ICD-10-CM

## 2013-05-12 ENCOUNTER — Ambulatory Visit: Payer: Self-pay | Admitting: Pain Medicine

## 2013-05-13 ENCOUNTER — Ambulatory Visit: Payer: Self-pay | Admitting: Family Medicine

## 2013-05-20 ENCOUNTER — Ambulatory Visit: Payer: Self-pay | Admitting: Pain Medicine

## 2013-05-26 ENCOUNTER — Emergency Department: Payer: Self-pay | Admitting: Emergency Medicine

## 2013-05-29 LAB — BETA STREP CULTURE(ARMC)

## 2013-05-31 ENCOUNTER — Observation Stay: Payer: Self-pay | Admitting: Internal Medicine

## 2013-05-31 LAB — APTT: Activated PTT: 28 secs (ref 23.6–35.9)

## 2013-05-31 LAB — COMPREHENSIVE METABOLIC PANEL
Alkaline Phosphatase: 94 U/L (ref 50–136)
Anion Gap: 10 (ref 7–16)
Bilirubin,Total: 0.3 mg/dL (ref 0.2–1.0)
Calcium, Total: 10.1 mg/dL (ref 8.5–10.1)
Co2: 23 mmol/L (ref 21–32)
Creatinine: 0.95 mg/dL (ref 0.60–1.30)
EGFR (African American): >60
Glucose: 99 mg/dL (ref 65–99)
Osmolality: 280 (ref 275–301)
Potassium: 3.7 mmol/L (ref 3.5–5.1)
SGOT(AST): 11 U/L — ABNORMAL LOW (ref 15–37)
SGPT (ALT): 16 U/L (ref 12–78)
Total Protein: 7.8 g/dL (ref 6.4–8.2)

## 2013-05-31 LAB — PROTIME-INR: Prothrombin Time: 12 secs (ref 11.5–14.7)

## 2013-05-31 LAB — CK TOTAL AND CKMB (NOT AT ARMC)
CK, Total: 33 U/L (ref 21–215)
CK-MB: 0.7 ng/mL (ref 0.5–3.6)

## 2013-05-31 LAB — CBC
MCH: 27.3 pg (ref 26.0–34.0)
MCHC: 32.5 g/dL (ref 32.0–36.0)
MCV: 84 fL (ref 80–100)
Platelet: 550 10*3/uL — ABNORMAL HIGH (ref 150–440)
RBC: 4.88 10*6/uL (ref 3.80–5.20)
RDW: 16.4 % — ABNORMAL HIGH (ref 11.5–14.5)
WBC: 21.4 10*3/uL — ABNORMAL HIGH (ref 3.6–11.0)

## 2013-06-01 DIAGNOSIS — R079 Chest pain, unspecified: Secondary | ICD-10-CM

## 2013-06-01 LAB — BASIC METABOLIC PANEL
Anion Gap: 6 — ABNORMAL LOW (ref 7–16)
Calcium, Total: 9.1 mg/dL (ref 8.5–10.1)
Co2: 25 mmol/L (ref 21–32)
EGFR (Non-African Amer.): >60
Glucose: 97 mg/dL (ref 65–99)
Potassium: 3.9 mmol/L (ref 3.5–5.1)

## 2013-06-01 LAB — TROPONIN I
Troponin-I: 0.03 ng/mL
Troponin-I: <0.02 ng/mL

## 2013-06-01 LAB — URINALYSIS, COMPLETE
Bacteria: NONE SEEN
Bilirubin,UR: NEGATIVE
Glucose,UR: NEGATIVE mg/dL (ref 0–75)
Ketone: NEGATIVE
Leukocyte Esterase: NEGATIVE
Nitrite: NEGATIVE
Ph: 6 (ref 4.5–8.0)
Protein: NEGATIVE
Specific Gravity: 1.009 (ref 1.003–1.030)
WBC UR: 1 /HPF (ref 0–5)

## 2013-06-01 LAB — CBC WITH DIFFERENTIAL/PLATELET
Basophil #: 0.2 10*3/uL — ABNORMAL HIGH (ref 0.0–0.1)
Basophil %: 1.2 %
Eosinophil #: 0.2 10*3/uL (ref 0.0–0.7)
HCT: 34.5 % — ABNORMAL LOW (ref 35.0–47.0)
MCH: 27.4 pg (ref 26.0–34.0)
MCHC: 32.5 g/dL (ref 32.0–36.0)
MCV: 84 fL (ref 80–100)
Monocyte %: 6.4 %
Neutrophil %: 66 %
Platelet: 452 10*3/uL — ABNORMAL HIGH (ref 150–440)
RDW: 16.8 % — ABNORMAL HIGH (ref 11.5–14.5)
WBC: 15.3 10*3/uL — ABNORMAL HIGH (ref 3.6–11.0)

## 2013-06-01 LAB — CK TOTAL AND CKMB (NOT AT ARMC)
CK, Total: 21 U/L (ref 21–215)
CK-MB: <0.5 ng/mL — ABNORMAL LOW (ref 0.5–3.6)

## 2013-06-01 LAB — LIPID PANEL
HDL Cholesterol: 47 mg/dL (ref 40–60)
Ldl Cholesterol, Calc: 90 mg/dL (ref 0–100)
Triglycerides: 183 mg/dL (ref 0–200)

## 2013-07-01 ENCOUNTER — Observation Stay: Payer: Self-pay | Admitting: Internal Medicine

## 2013-07-01 DIAGNOSIS — I359 Nonrheumatic aortic valve disorder, unspecified: Secondary | ICD-10-CM

## 2013-07-01 LAB — URINALYSIS, COMPLETE
Glucose,UR: NEGATIVE mg/dL (ref 0–75)
Ph: 7 (ref 4.5–8.0)
Protein: NEGATIVE
RBC,UR: 1 /HPF (ref 0–5)
Specific Gravity: 1.01 (ref 1.003–1.030)
WBC UR: 2 /HPF (ref 0–5)

## 2013-07-01 LAB — CBC WITH DIFFERENTIAL/PLATELET
Basophil #: 0.1 10*3/uL (ref 0.0–0.1)
Eosinophil #: 0.2 10*3/uL (ref 0.0–0.7)
Eosinophil %: 1.2 %
HGB: 12.5 g/dL (ref 12.0–16.0)
MCHC: 33 g/dL (ref 32.0–36.0)
MCV: 85 fL (ref 80–100)
Monocyte %: 6.6 %
Neutrophil #: 9.8 10*3/uL — ABNORMAL HIGH (ref 1.4–6.5)
Neutrophil %: 65.3 %
RBC: 4.48 10*6/uL (ref 3.80–5.20)
RDW: 17.7 % — ABNORMAL HIGH (ref 11.5–14.5)
WBC: 15 10*3/uL — ABNORMAL HIGH (ref 3.6–11.0)

## 2013-07-01 LAB — COMPREHENSIVE METABOLIC PANEL
Albumin: 3.6 g/dL (ref 3.4–5.0)
Anion Gap: 7 (ref 7–16)
BUN: 9 mg/dL (ref 7–18)
Bilirubin,Total: 0.3 mg/dL (ref 0.2–1.0)
Calcium, Total: 9.5 mg/dL (ref 8.5–10.1)
Chloride: 108 mmol/L — ABNORMAL HIGH (ref 98–107)
Co2: 25 mmol/L (ref 21–32)
EGFR (Non-African Amer.): >60
Glucose: 82 mg/dL (ref 65–99)
SGOT(AST): 12 U/L — ABNORMAL LOW (ref 15–37)
SGPT (ALT): 17 U/L (ref 12–78)
Total Protein: 7 g/dL (ref 6.4–8.2)

## 2013-07-02 IMAGING — CT CT ABD-PELV W/ CM
1 of 2 series · 15 of 32 positions shown, 19 images · IV contrast (isovue)
Comparison: none

REASON FOR EXAM: (1) right flank pain, nausea; (2) right flank pain,
nausea
COMMENTS:

PROCEDURE:     CT  - CT ABDOMEN / PELVIS  W  - March 10, 2013  [DATE]
RESULT:     Comparison:  06/11/2012
TECHNIQUE: Multiple axial images of the abdomen and pelvis were performed
from the lung bases to the pubic symphysis, without p.o. contrast and with
85 mL of Isovue 370 intravenous contrast.

[Series 2: 3mm soft tissue · axial · 0.65mm/px · z∈[-426,-60]mm · 15 of 134 slices shown, 19 images]
[im 6/134  soft-tissue]
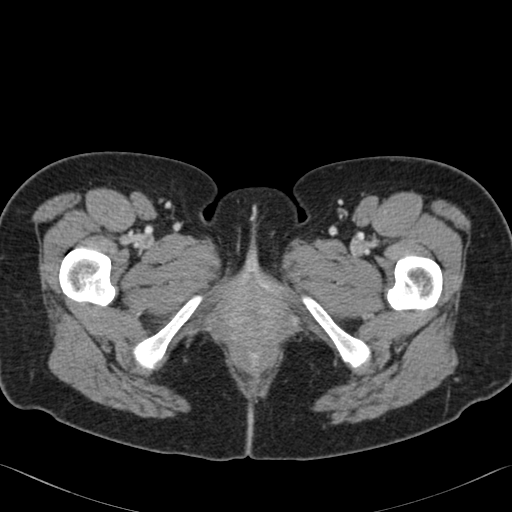
[im 6/134  bone]
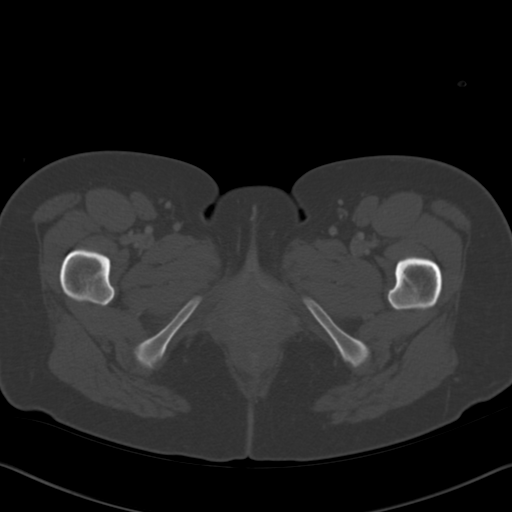
[im 17/134  soft-tissue]
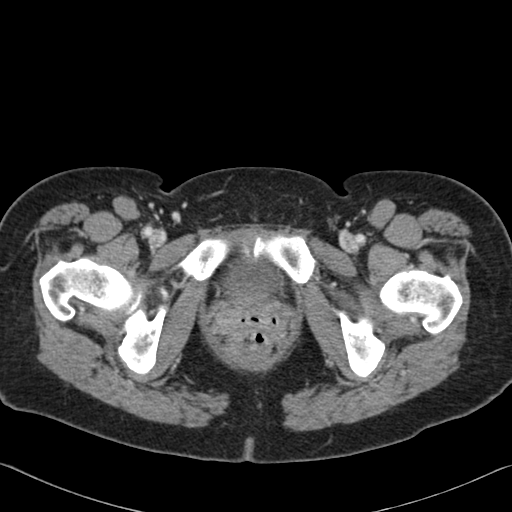
[im 28/134  soft-tissue]
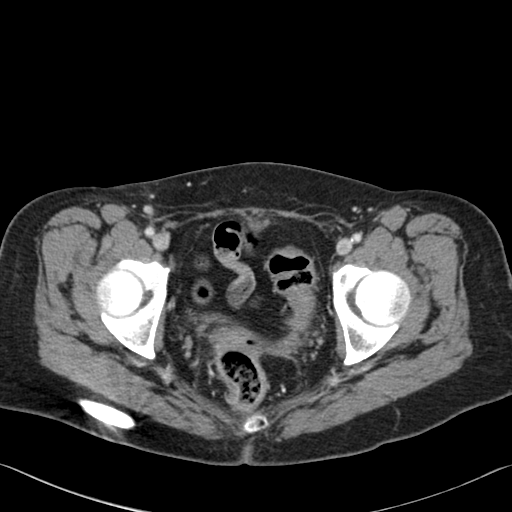
[im 39/134  soft-tissue]
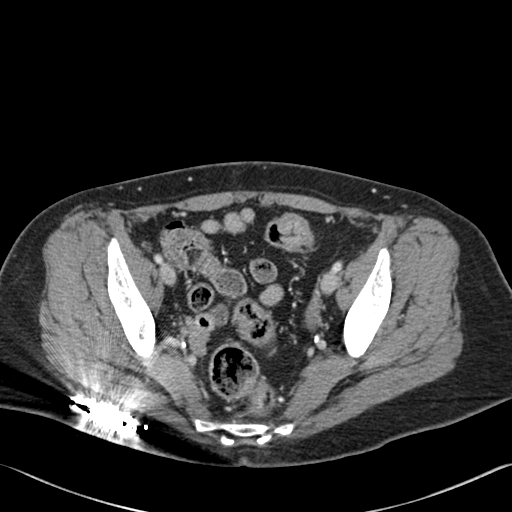
[im 45/134  soft-tissue]
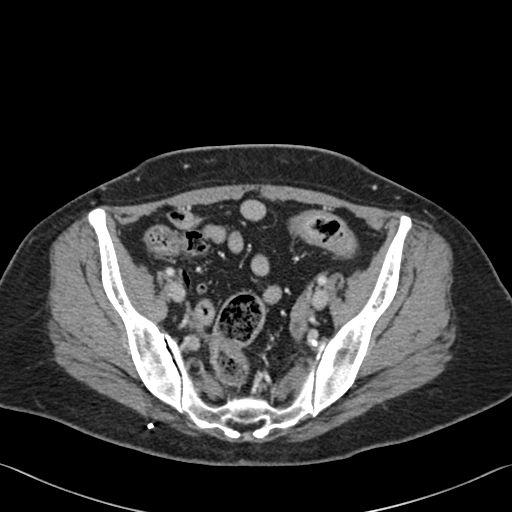
[im 56/134  soft-tissue]
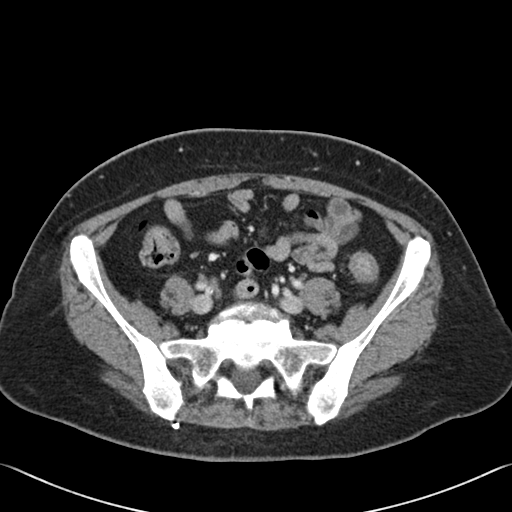
[im 67/134  soft-tissue]
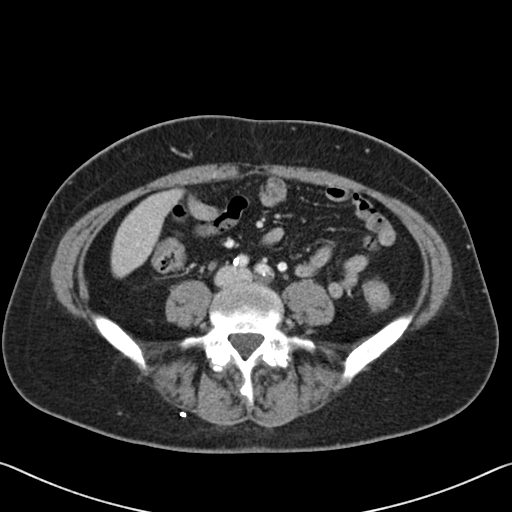
[im 78/134  soft-tissue]
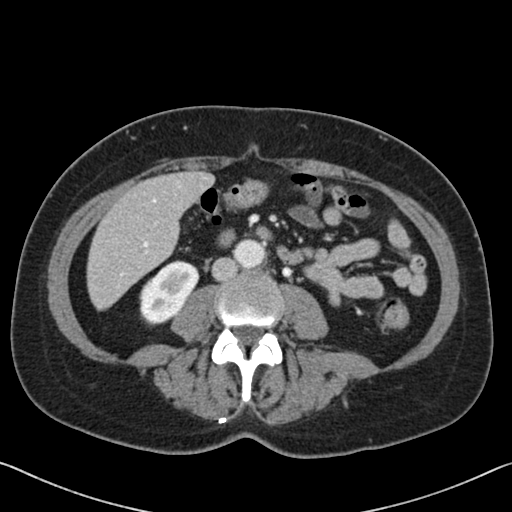
[im 89/134  soft-tissue]
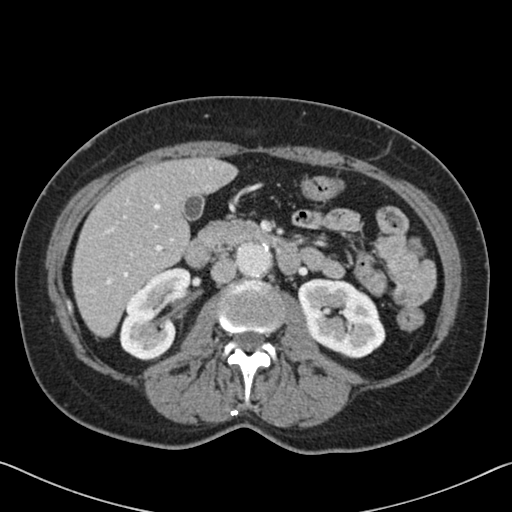
[im 89/134  bone]
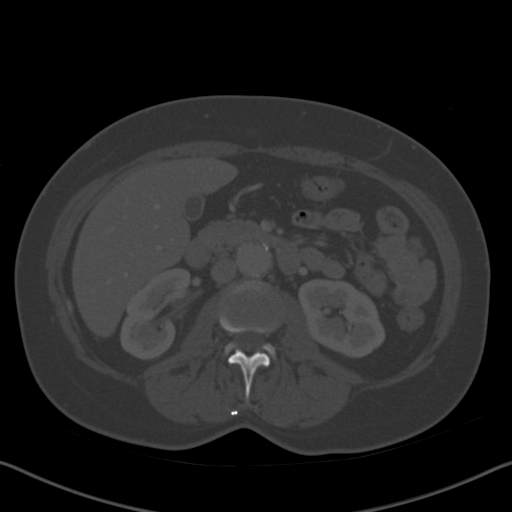
[im 95/134  soft-tissue]
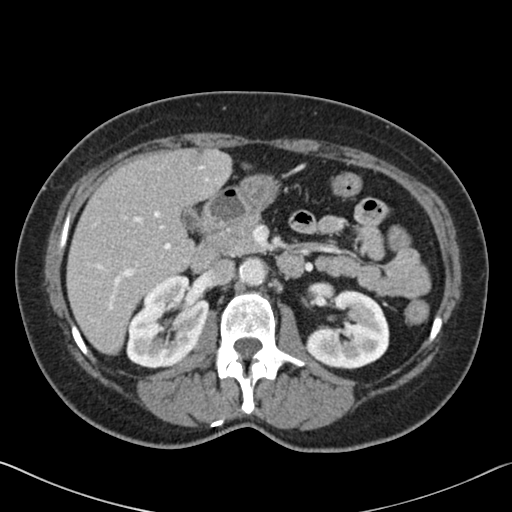
[im 106/134  soft-tissue]
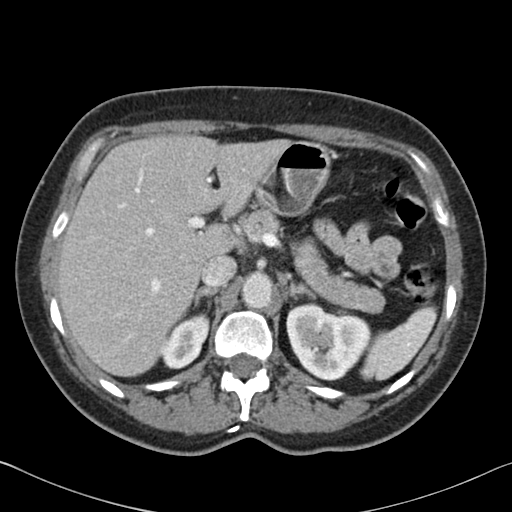
[im 111/134  lung]
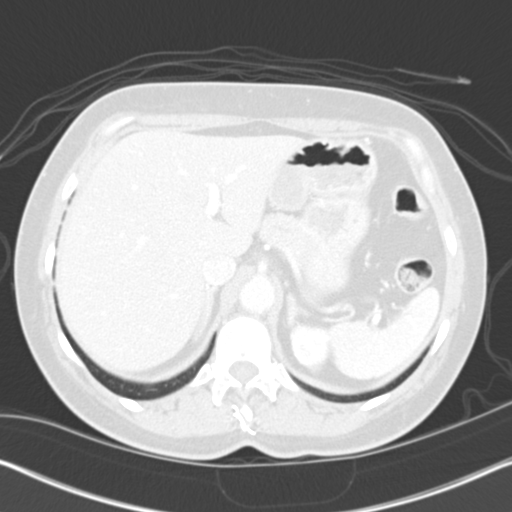
[im 117/134  soft-tissue]
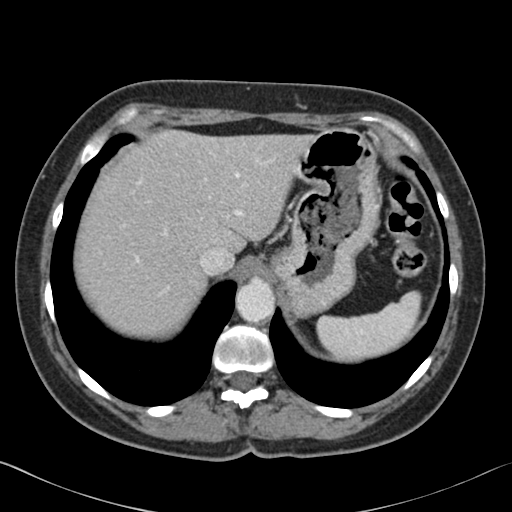
[im 117/134  lung]
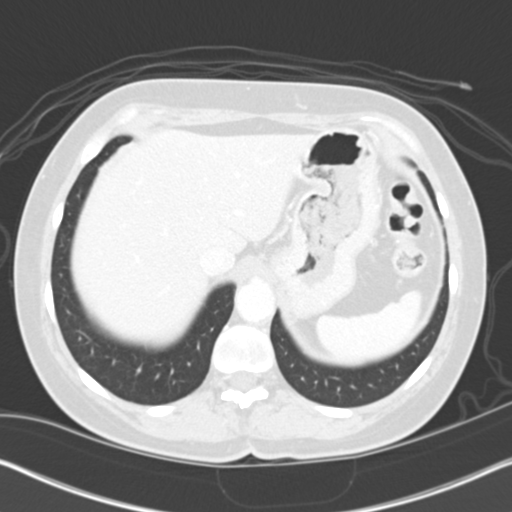
[im 122/134  lung]
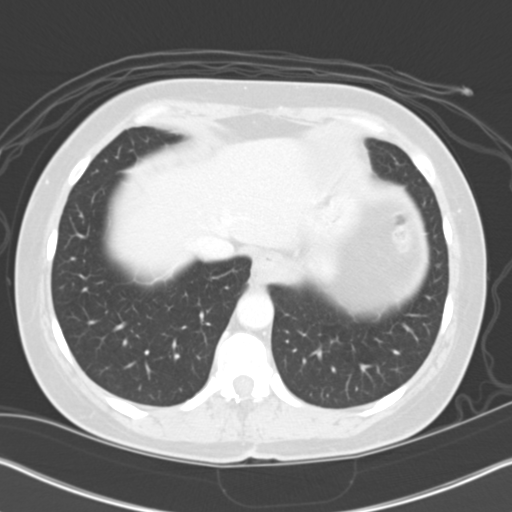
[im 128/134  soft-tissue]
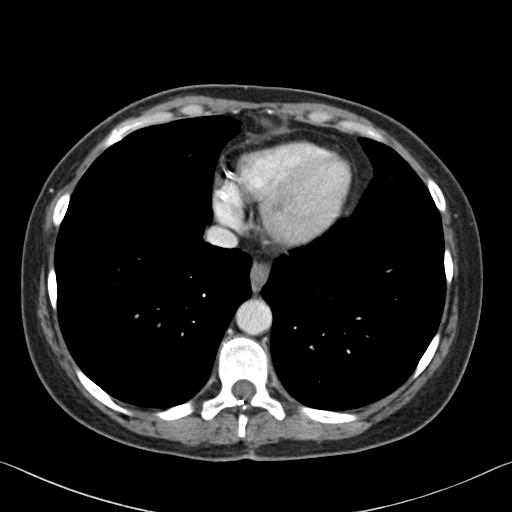
[im 128/134  lung]
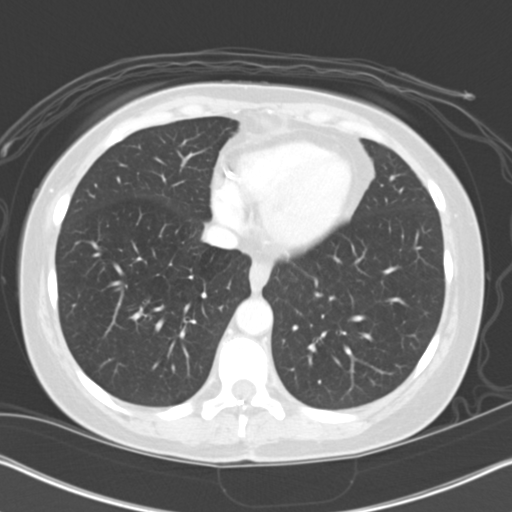

[15 of 32 positions shown; findings below may reference images not displayed]

FINDINGS: Calcifications are seen in the coronary arteries. Small area of
low-attenuation adjacent to the falciform ligament is similar to prior and
likely related to focal fatty deposition. The gallbladder, adrenals, and
pancreas are unremarkable. Punctate calcifications in the spleen are likely
sequela of old prior infection. There is a 2 mm calculus in the right
kidney. No hydronephrosis.

There is ectasia of the infrarenal abdominal aorta, measuring 2.7 cm in
diameter. The patient is status post hysterectomy.

Mild prominence of the wall the sigmoid colon is slightly secondary to
underdistention. The appendix is normal.

No aggressive lytic or sclerotic osseous lesions are identified. A spinal
stimulator device is present.
IMPRESSION: 1. Mild prominence of the wall the sigmoid colon is likely secondary to
underdistention. However, correlate for colitis.
2. Otherwise, no acute findings in the abdomen or pelvis.
3. Right-sided nephrolithiasis, without hydronephrosis.

[REDACTED]

## 2013-07-31 IMAGING — US ABDOMEN ULTRASOUND
1 series · 13 of 25 positions shown · non-contrast
Comparison: none

REASON FOR EXAM: abd pain - epigastric - eval for biliary dis.  Pt has hx
of AAA - eval for Jeampiere
COMMENTS:   May transport without cardiac monitor

[Series 1: abdomen ultrasound · 0.26mm/px · 13 of 109 slices shown]
[im 1/109]
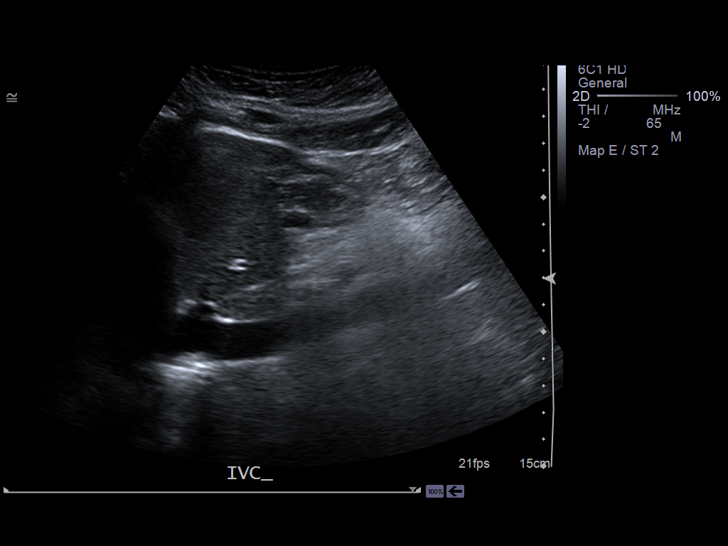
[im 10/109]
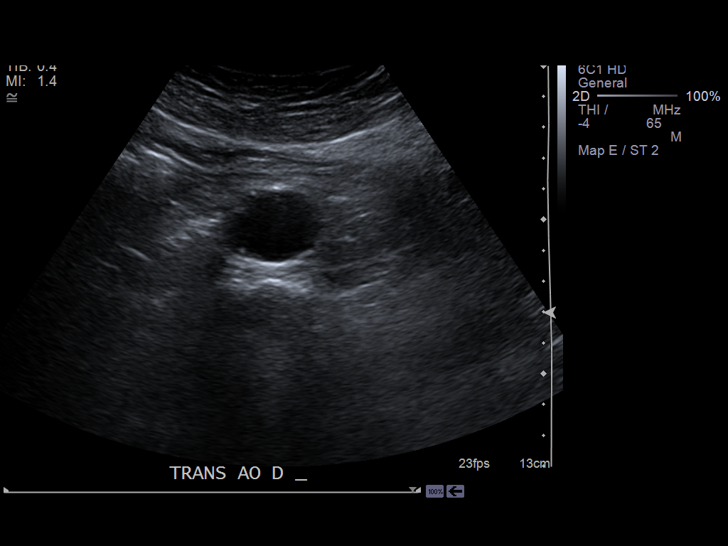
[im 19/109]
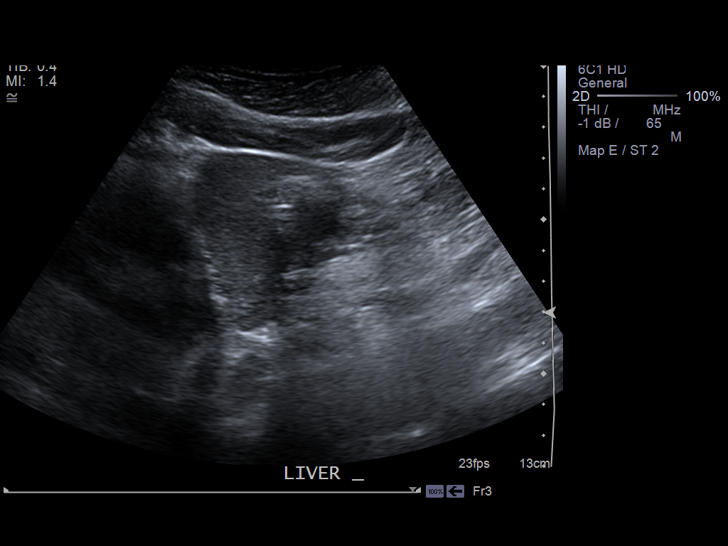
[im 28/109]
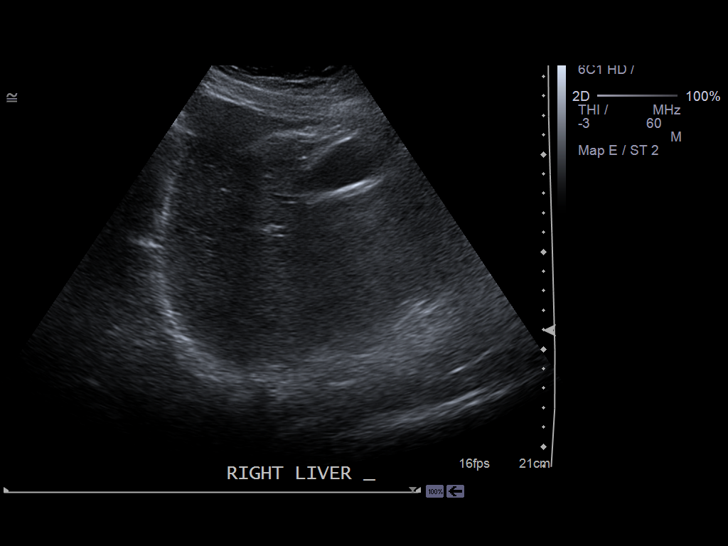
[im 37/109]
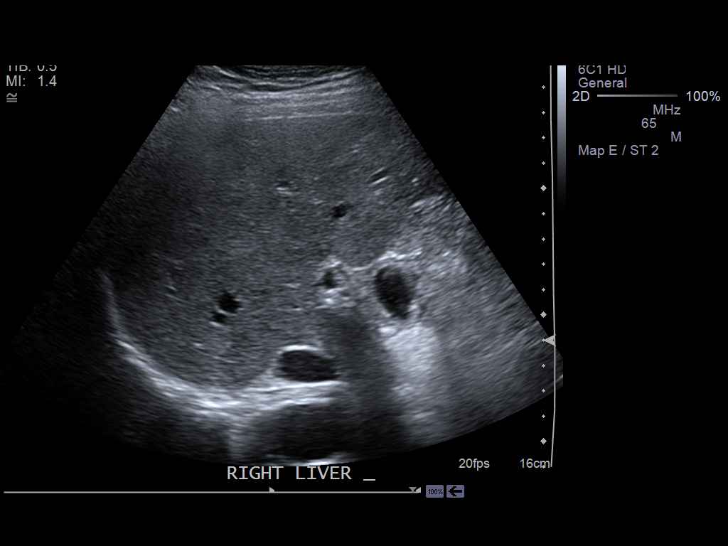
[im 46/109]
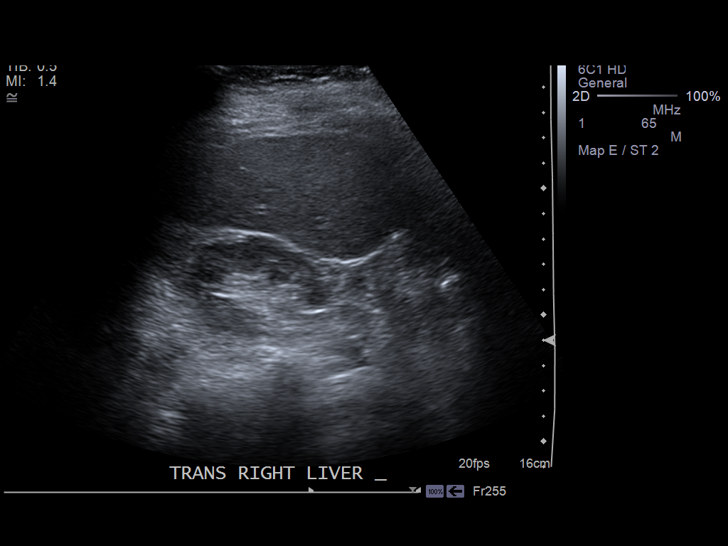
[im 55/109]
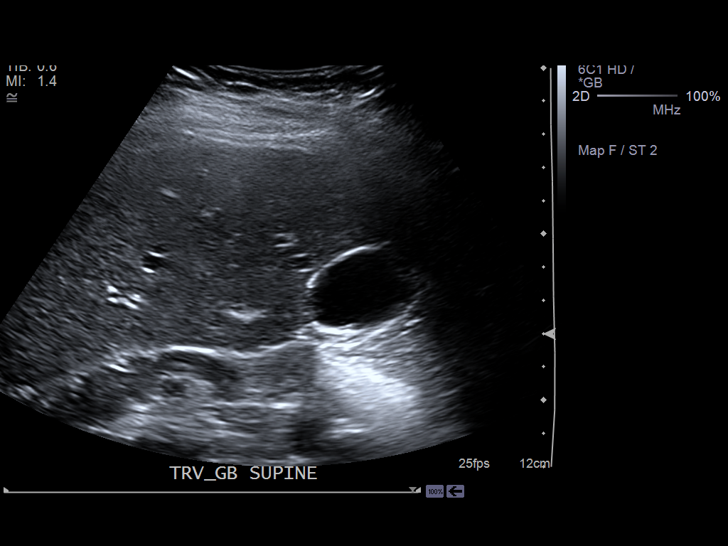
[im 64/109]
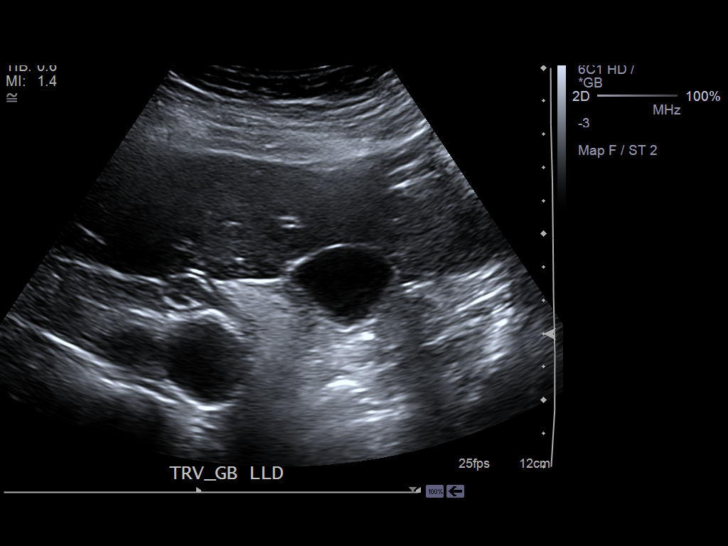
[im 73/109]
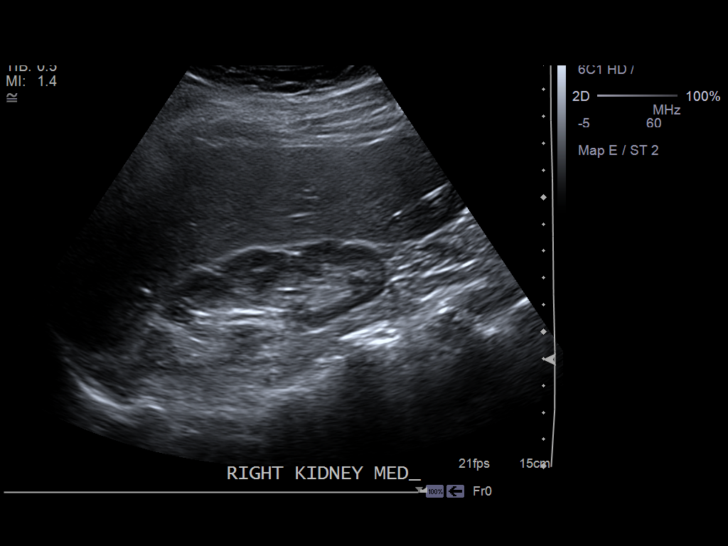
[im 82/109]
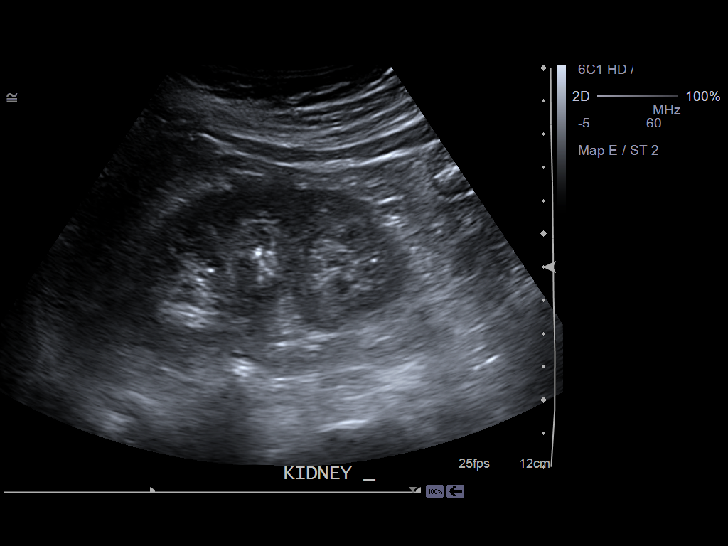
[im 91/109]
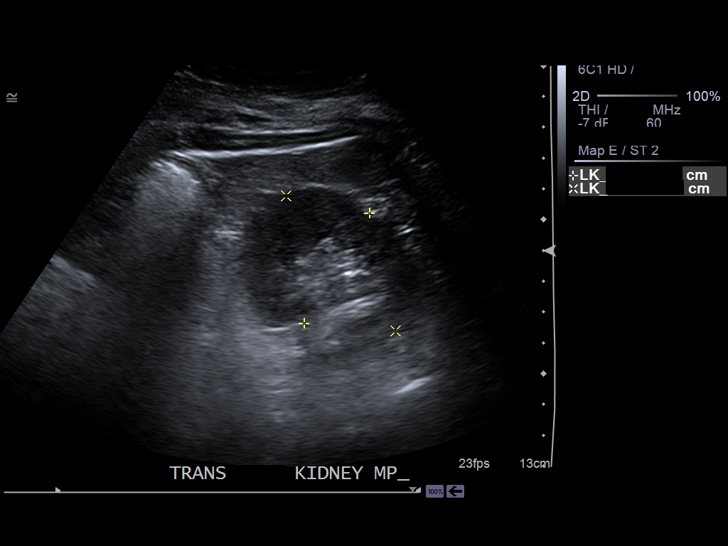
[im 100/109]
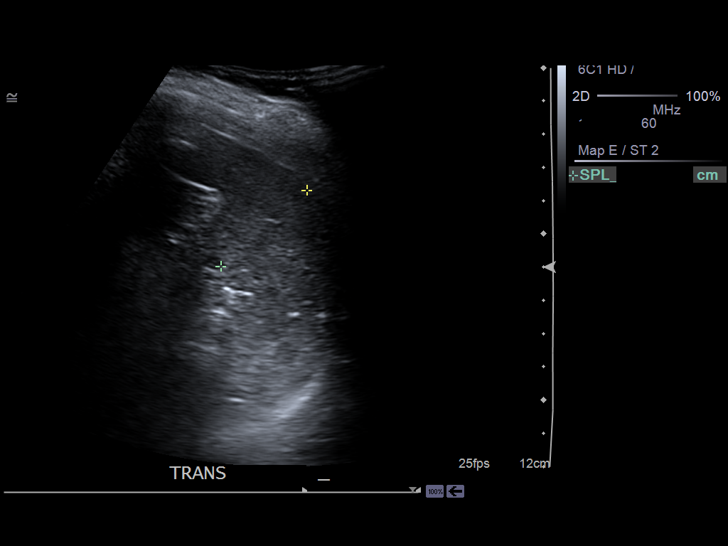
[im 109/109]
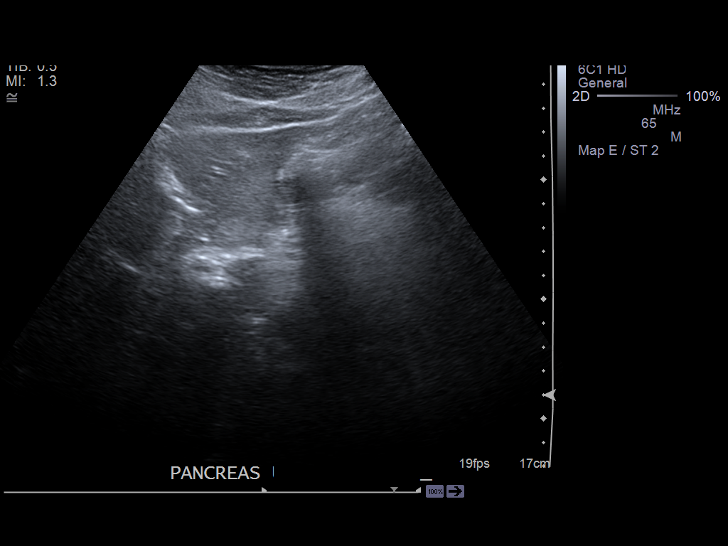

[13 of 25 positions shown; findings below may reference images not displayed]

PROCEDURE:     US  - US ABDOMEN GENERAL SURVEY  - April 09, 2013 [DATE]

RESULT:     Emergent abdominal sonogram is performed. Patient has a previous
abdominal ultrasound dated 06/11/2012. There is also previous exam dated
04/03/2012. Both of these studies were limited right upper quadrant abdominal
ultrasound. Today's examination shows poor visualization of the liver
because of overlying bowel gas which also obscures portions of the abdominal
aorta. The in the infrarenal abdominal aorta there is aneurysmal dilation
with a maximal AP dimension measured 2.72 cm a transverse dimension of
cm. The visualized aorta distal to this appears to taper. The liver length
is 16.68 cm. Portal venous flow is normal. The common bile duct diameter is
2.8 mm. Gallbladder wall thickness is 1.6 mm. No cholelithiasis or changes
of acute cholecystitis are evident. A negative sonographic Murphy's sign is
reported. Right kidney length is 10.59 cm. Left kidney length is 9.0 cm. In
there is no solid or cystic mass or hydronephrosis. No definite stones are
identified. There are some tiny punctate areas of echogenicity within the
spleen which may represent granulomatous calcifications. There is some
echogenic nonshadowing structure in the mid left kidney which could
represent a nonobstructing stone. No ascites is evident. In the proximal
inferior vena cava appears patent.
IMPRESSION: No abdominal aortic aneurysm with a maximal AP dimension of
2.7 cm. Poor visualization of the pancreas. Granulomatous calcifications in
the spleen. No cholelithiasis or evidence of acute cholecystitis.

[REDACTED]

## 2013-10-23 ENCOUNTER — Emergency Department: Payer: Self-pay | Admitting: Emergency Medicine

## 2013-10-23 LAB — COMPREHENSIVE METABOLIC PANEL
Albumin: 4.1 g/dL (ref 3.4–5.0)
Alkaline Phosphatase: 100 U/L
Anion Gap: 5 — ABNORMAL LOW (ref 7–16)
BUN: 6 mg/dL — ABNORMAL LOW (ref 7–18)
Bilirubin,Total: 0.4 mg/dL (ref 0.2–1.0)
Calcium, Total: 10.1 mg/dL (ref 8.5–10.1)
Chloride: 104 mmol/L (ref 98–107)
Co2: 26 mmol/L (ref 21–32)
EGFR (African American): >60
Glucose: 100 mg/dL — ABNORMAL HIGH (ref 65–99)
Osmolality: 268 (ref 275–301)
SGOT(AST): 21 U/L (ref 15–37)
Total Protein: 8.6 g/dL — ABNORMAL HIGH (ref 6.4–8.2)

## 2013-10-23 LAB — CBC WITH DIFFERENTIAL/PLATELET
Basophil #: 0.1 10*3/uL (ref 0.0–0.1)
Basophil %: 0.8 %
Eosinophil #: 0.4 10*3/uL (ref 0.0–0.7)
HCT: 44.7 % (ref 35.0–47.0)
HGB: 14.2 g/dL (ref 12.0–16.0)
Lymphocyte #: 3.2 10*3/uL (ref 1.0–3.6)
Lymphocyte %: 25.4 %
MCH: 26.4 pg (ref 26.0–34.0)
MCHC: 31.8 g/dL — ABNORMAL LOW (ref 32.0–36.0)
Monocyte #: 0.9 x10 3/mm (ref 0.2–0.9)
Neutrophil #: 8.1 10*3/uL — ABNORMAL HIGH (ref 1.4–6.5)
Neutrophil %: 63.6 %
RDW: 17.9 % — ABNORMAL HIGH (ref 11.5–14.5)
WBC: 12.8 10*3/uL — ABNORMAL HIGH (ref 3.6–11.0)

## 2013-10-23 LAB — URINALYSIS, COMPLETE
Bacteria: NONE SEEN
Bilirubin,UR: NEGATIVE
Glucose,UR: NEGATIVE mg/dL (ref 0–75)
Leukocyte Esterase: NEGATIVE
Nitrite: NEGATIVE
Ph: 7 (ref 4.5–8.0)
Protein: NEGATIVE

## 2013-10-23 LAB — TROPONIN I: Troponin-I: <0.02 ng/mL

## 2013-11-07 ENCOUNTER — Emergency Department: Payer: Self-pay | Admitting: Emergency Medicine

## 2013-11-07 LAB — URINALYSIS, COMPLETE
Bacteria: NONE SEEN
Bilirubin,UR: NEGATIVE
Blood: NEGATIVE
Ph: 7 (ref 4.5–8.0)
RBC,UR: NONE SEEN /HPF (ref 0–5)
Squamous Epithelial: 1
WBC UR: NONE SEEN /HPF (ref 0–5)

## 2013-11-07 LAB — COMPREHENSIVE METABOLIC PANEL
Albumin: 4.2 g/dL (ref 3.4–5.0)
Bilirubin,Total: 0.4 mg/dL (ref 0.2–1.0)
Chloride: 107 mmol/L (ref 98–107)
Creatinine: 0.63 mg/dL (ref 0.60–1.30)
EGFR (African American): >60
EGFR (Non-African Amer.): >60
Glucose: 96 mg/dL (ref 65–99)
Sodium: 139 mmol/L (ref 136–145)
Total Protein: 8 g/dL (ref 6.4–8.2)

## 2013-11-07 LAB — CBC
HCT: 41.8 % (ref 35.0–47.0)
MCV: 83 fL (ref 80–100)
Platelet: 305 10*3/uL (ref 150–440)
RDW: 17.4 % — ABNORMAL HIGH (ref 11.5–14.5)
WBC: 15.3 10*3/uL — ABNORMAL HIGH (ref 3.6–11.0)

## 2013-11-15 ENCOUNTER — Emergency Department: Payer: Self-pay | Admitting: Emergency Medicine

## 2013-11-15 LAB — COMPREHENSIVE METABOLIC PANEL
ALT: 19 U/L (ref 12–78)
Albumin: 3.9 g/dL (ref 3.4–5.0)
Alkaline Phosphatase: 89 U/L
Anion Gap: 5 — ABNORMAL LOW (ref 7–16)
BUN: 6 mg/dL — ABNORMAL LOW (ref 7–18)
Bilirubin,Total: 0.4 mg/dL (ref 0.2–1.0)
Calcium, Total: 9.3 mg/dL (ref 8.5–10.1)
Chloride: 108 mmol/L — ABNORMAL HIGH (ref 98–107)
Co2: 22 mmol/L (ref 21–32)
Creatinine: 0.61 mg/dL (ref 0.60–1.30)
GLUCOSE: 96 mg/dL (ref 65–99)
Osmolality: 268 (ref 275–301)
Potassium: 3.3 mmol/L — ABNORMAL LOW (ref 3.5–5.1)
SGOT(AST): 26 U/L (ref 15–37)
Sodium: 135 mmol/L — ABNORMAL LOW (ref 136–145)
Total Protein: 7.6 g/dL (ref 6.4–8.2)

## 2013-11-15 LAB — CBC WITH DIFFERENTIAL/PLATELET
BASOS PCT: 0.7 %
Basophil #: 0.1 10*3/uL (ref 0.0–0.1)
EOS PCT: 1.7 %
Eosinophil #: 0.2 10*3/uL (ref 0.0–0.7)
HCT: 41.3 % (ref 35.0–47.0)
HGB: 13.6 g/dL (ref 12.0–16.0)
LYMPHS ABS: 2.3 10*3/uL (ref 1.0–3.6)
Lymphocyte %: 21 %
MCH: 27.6 pg (ref 26.0–34.0)
MCHC: 32.9 g/dL (ref 32.0–36.0)
MCV: 84 fL (ref 80–100)
MONO ABS: 0.7 x10 3/mm (ref 0.2–0.9)
Monocyte %: 6.3 %
NEUTROS ABS: 7.7 10*3/uL — AB (ref 1.4–6.5)
Neutrophil %: 70.3 %
PLATELETS: 253 10*3/uL (ref 150–440)
RBC: 4.93 10*6/uL (ref 3.80–5.20)
RDW: 17.7 % — ABNORMAL HIGH (ref 11.5–14.5)
WBC: 11 10*3/uL (ref 3.6–11.0)

## 2013-11-15 LAB — URINALYSIS, COMPLETE
BILIRUBIN, UR: NEGATIVE
Glucose,UR: NEGATIVE mg/dL (ref 0–75)
Ketone: NEGATIVE
Nitrite: NEGATIVE
PROTEIN: NEGATIVE
Ph: 7 (ref 4.5–8.0)
RBC,UR: 1 /HPF (ref 0–5)
Specific Gravity: 1.003 (ref 1.003–1.030)
WBC UR: 1 /HPF (ref 0–5)

## 2013-11-15 LAB — TROPONIN I: Troponin-I: <0.02 ng/mL

## 2013-11-15 LAB — LIPASE, BLOOD: LIPASE: 88 U/L (ref 73–393)

## 2013-11-16 ENCOUNTER — Emergency Department: Payer: Self-pay | Admitting: Emergency Medicine

## 2013-11-16 LAB — CBC WITH DIFFERENTIAL/PLATELET
BASOS ABS: 0.1 10*3/uL (ref 0.0–0.1)
Basophil %: 0.8 %
EOS PCT: 1.9 %
Eosinophil #: 0.2 10*3/uL (ref 0.0–0.7)
HCT: 40.1 % (ref 35.0–47.0)
HGB: 13 g/dL (ref 12.0–16.0)
Lymphocyte #: 2.1 10*3/uL (ref 1.0–3.6)
Lymphocyte %: 16.6 %
MCH: 27 pg (ref 26.0–34.0)
MCHC: 32.5 g/dL (ref 32.0–36.0)
MCV: 83 fL (ref 80–100)
Monocyte #: 0.8 x10 3/mm (ref 0.2–0.9)
Monocyte %: 6.4 %
Neutrophil #: 9.4 10*3/uL — ABNORMAL HIGH (ref 1.4–6.5)
Neutrophil %: 74.3 %
Platelet: 229 10*3/uL (ref 150–440)
RBC: 4.82 10*6/uL (ref 3.80–5.20)
RDW: 17.8 % — ABNORMAL HIGH (ref 11.5–14.5)
WBC: 12.7 10*3/uL — ABNORMAL HIGH (ref 3.6–11.0)

## 2013-11-16 LAB — COMPREHENSIVE METABOLIC PANEL
Albumin: 3.7 g/dL (ref 3.4–5.0)
Alkaline Phosphatase: 83 U/L
Anion Gap: 6 — ABNORMAL LOW (ref 7–16)
BUN: 4 mg/dL — AB (ref 7–18)
Bilirubin,Total: 0.3 mg/dL (ref 0.2–1.0)
CALCIUM: 9.4 mg/dL (ref 8.5–10.1)
CO2: 24 mmol/L (ref 21–32)
Chloride: 108 mmol/L — ABNORMAL HIGH (ref 98–107)
Creatinine: 0.62 mg/dL (ref 0.60–1.30)
EGFR (African American): >60
GLUCOSE: 92 mg/dL (ref 65–99)
Osmolality: 272 (ref 275–301)
POTASSIUM: 3.5 mmol/L (ref 3.5–5.1)
SGOT(AST): 20 U/L (ref 15–37)
SGPT (ALT): 16 U/L (ref 12–78)
SODIUM: 138 mmol/L (ref 136–145)
TOTAL PROTEIN: 7.4 g/dL (ref 6.4–8.2)

## 2013-11-16 LAB — URINALYSIS, COMPLETE
Bilirubin,UR: NEGATIVE
GLUCOSE, UR: NEGATIVE mg/dL (ref 0–75)
Ketone: NEGATIVE
LEUKOCYTE ESTERASE: NEGATIVE
NITRITE: NEGATIVE
PROTEIN: NEGATIVE
Ph: 6 (ref 4.5–8.0)
RBC,UR: <1 /HPF (ref 0–5)
SPECIFIC GRAVITY: 1.005 (ref 1.003–1.030)

## 2013-11-16 LAB — LIPASE, BLOOD: Lipase: 109 U/L (ref 73–393)

## 2013-11-16 LAB — TROPONIN I: Troponin-I: <0.02 ng/mL

## 2013-12-30 ENCOUNTER — Inpatient Hospital Stay (HOSPITAL_COMMUNITY)
Admission: EM | Admit: 2013-12-30 | Discharge: 2014-01-11 | DRG: 248 | Disposition: E | Payer: Medicare Other | Source: Other Acute Inpatient Hospital | Attending: Cardiovascular Disease | Admitting: Cardiovascular Disease

## 2013-12-30 ENCOUNTER — Ambulatory Visit (HOSPITAL_COMMUNITY): Admit: 2013-12-30 | Payer: Self-pay | Admitting: Cardiovascular Disease

## 2013-12-30 ENCOUNTER — Encounter (HOSPITAL_COMMUNITY)
Admission: EM | Disposition: E | Payer: Medicare Other | Source: Other Acute Inpatient Hospital | Attending: Cardiovascular Disease

## 2013-12-30 ENCOUNTER — Emergency Department: Payer: Self-pay | Admitting: Emergency Medicine

## 2013-12-30 DIAGNOSIS — I4901 Ventricular fibrillation: Secondary | ICD-10-CM | POA: Diagnosis present

## 2013-12-30 DIAGNOSIS — K449 Diaphragmatic hernia without obstruction or gangrene: Secondary | ICD-10-CM | POA: Diagnosis present

## 2013-12-30 DIAGNOSIS — I10 Essential (primary) hypertension: Secondary | ICD-10-CM | POA: Diagnosis present

## 2013-12-30 DIAGNOSIS — I472 Ventricular tachycardia, unspecified: Secondary | ICD-10-CM | POA: Diagnosis present

## 2013-12-30 DIAGNOSIS — I2510 Atherosclerotic heart disease of native coronary artery without angina pectoris: Secondary | ICD-10-CM | POA: Diagnosis present

## 2013-12-30 DIAGNOSIS — J9600 Acute respiratory failure, unspecified whether with hypoxia or hypercapnia: Secondary | ICD-10-CM

## 2013-12-30 DIAGNOSIS — Z79899 Other long term (current) drug therapy: Secondary | ICD-10-CM

## 2013-12-30 DIAGNOSIS — Y849 Medical procedure, unspecified as the cause of abnormal reaction of the patient, or of later complication, without mention of misadventure at the time of the procedure: Secondary | ICD-10-CM | POA: Diagnosis present

## 2013-12-30 DIAGNOSIS — I219 Acute myocardial infarction, unspecified: Principal | ICD-10-CM

## 2013-12-30 DIAGNOSIS — R570 Cardiogenic shock: Secondary | ICD-10-CM | POA: Diagnosis present

## 2013-12-30 DIAGNOSIS — Z7982 Long term (current) use of aspirin: Secondary | ICD-10-CM

## 2013-12-30 DIAGNOSIS — I469 Cardiac arrest, cause unspecified: Secondary | ICD-10-CM

## 2013-12-30 DIAGNOSIS — I2582 Chronic total occlusion of coronary artery: Secondary | ICD-10-CM | POA: Diagnosis present

## 2013-12-30 DIAGNOSIS — K219 Gastro-esophageal reflux disease without esophagitis: Secondary | ICD-10-CM | POA: Diagnosis present

## 2013-12-30 DIAGNOSIS — G9340 Encephalopathy, unspecified: Secondary | ICD-10-CM | POA: Diagnosis present

## 2013-12-30 DIAGNOSIS — Z7902 Long term (current) use of antithrombotics/antiplatelets: Secondary | ICD-10-CM

## 2013-12-30 DIAGNOSIS — E785 Hyperlipidemia, unspecified: Secondary | ICD-10-CM | POA: Diagnosis present

## 2013-12-30 DIAGNOSIS — IMO0001 Reserved for inherently not codable concepts without codable children: Secondary | ICD-10-CM | POA: Diagnosis present

## 2013-12-30 DIAGNOSIS — I213 ST elevation (STEMI) myocardial infarction of unspecified site: Secondary | ICD-10-CM

## 2013-12-30 DIAGNOSIS — T82897A Other specified complication of cardiac prosthetic devices, implants and grafts, initial encounter: Secondary | ICD-10-CM | POA: Diagnosis present

## 2013-12-30 DIAGNOSIS — I4729 Other ventricular tachycardia: Secondary | ICD-10-CM | POA: Diagnosis present

## 2013-12-30 HISTORY — PX: LEFT HEART CATHETERIZATION WITH CORONARY ANGIOGRAM: SHX5451

## 2013-12-30 HISTORY — PX: PERCUTANEOUS CORONARY STENT INTERVENTION (PCI-S): SHX5485

## 2013-12-30 LAB — BLOOD GAS, ARTERIAL
Acid-base deficit: 10.2 mmol/L — ABNORMAL HIGH (ref 0.0–2.0)
Bicarbonate: 13.9 mEq/L — ABNORMAL LOW (ref 20.0–24.0)
FIO2: 1 %
LHR: 30 {breaths}/min
MECHVT: 500 mL
O2 Saturation: 99.6 %
PCO2 ART: 22.9 mmHg — AB (ref 35.0–45.0)
PEEP/CPAP: 5 cmH2O
PH ART: 7.397 (ref 7.350–7.450)
Patient temperature: 97
TCO2: 14.7 mmol/L (ref 0–100)
pO2, Arterial: 223 mmHg — ABNORMAL HIGH (ref 80.0–100.0)

## 2013-12-30 LAB — POCT I-STAT 3, ART BLOOD GAS (G3+)
ACID-BASE DEFICIT: 15 mmol/L — AB (ref 0.0–2.0)
Acid-base deficit: 18 mmol/L — ABNORMAL HIGH (ref 0.0–2.0)
Acid-base deficit: 3 mmol/L — ABNORMAL HIGH (ref 0.0–2.0)
Bicarbonate: 13.6 mEq/L — ABNORMAL LOW (ref 20.0–24.0)
Bicarbonate: 9.4 mEq/L — ABNORMAL LOW (ref 20.0–24.0)
Bicarbonate: 22.3 mEq/L (ref 20.0–24.0)
O2 Saturation: 99 %
O2 Saturation: 100 %
O2 Saturation: 100 %
PCO2 ART: 27.7 mmHg — AB (ref 35.0–45.0)
PH ART: 7.133 — AB (ref 7.350–7.450)
PO2 ART: 183 mmHg — AB (ref 80.0–100.0)
PO2 ART: 233 mmHg — AB (ref 80.0–100.0)
PO2 ART: 305 mmHg — AB (ref 80.0–100.0)
TCO2: 15 mmol/L (ref 0–100)
TCO2: 10 mmol/L (ref 0–100)
TCO2: 23 mmol/L (ref 0–100)
pCO2 arterial: 40.7 mmHg (ref 35.0–45.0)
pCO2 arterial: 40.2 mmHg (ref 35.0–45.0)
pH, Arterial: 7.138 — CL (ref 7.350–7.450)
pH, Arterial: 7.352 (ref 7.350–7.450)

## 2013-12-30 LAB — COMPREHENSIVE METABOLIC PANEL
Albumin: 2.8 g/dL — ABNORMAL LOW (ref 3.4–5.0)
Alkaline Phosphatase: 88 U/L
Anion Gap: 16 (ref 7–16)
BUN: 17 mg/dL (ref 7–18)
Bilirubin,Total: 0.2 mg/dL (ref 0.2–1.0)
CHLORIDE: 107 mmol/L (ref 98–107)
CO2: 12 mmol/L — AB (ref 21–32)
Calcium, Total: 8.2 mg/dL — ABNORMAL LOW (ref 8.5–10.1)
Creatinine: 1.46 mg/dL — ABNORMAL HIGH (ref 0.60–1.30)
EGFR (African American): 49 — ABNORMAL LOW
GFR CALC NON AF AMER: 42 — AB
Glucose: 410 mg/dL — ABNORMAL HIGH (ref 65–99)
Osmolality: 289 (ref 275–301)
POTASSIUM: 4.8 mmol/L (ref 3.5–5.1)
SGOT(AST): 84 U/L — ABNORMAL HIGH (ref 15–37)
SGPT (ALT): 50 U/L (ref 12–78)
Sodium: 135 mmol/L — ABNORMAL LOW (ref 136–145)
Total Protein: 5.8 g/dL — ABNORMAL LOW (ref 6.4–8.2)

## 2013-12-30 LAB — POCT ACTIVATED CLOTTING TIME: ACTIVATED CLOTTING TIME: 542 s

## 2013-12-30 LAB — GLUCOSE, CAPILLARY
GLUCOSE-CAPILLARY: 161 mg/dL — AB (ref 70–99)
GLUCOSE-CAPILLARY: 150 mg/dL — AB (ref 70–99)
Glucose-Capillary: 184 mg/dL — ABNORMAL HIGH (ref 70–99)

## 2013-12-30 LAB — CBC
HCT: 40.8 % (ref 35.0–47.0)
HGB: 12.5 g/dL (ref 12.0–16.0)
MCH: 27.6 pg (ref 26.0–34.0)
MCHC: 30.7 g/dL — ABNORMAL LOW (ref 32.0–36.0)
MCV: 90 fL (ref 80–100)
Platelet: 180 10*3/uL (ref 150–440)
RBC: 4.53 10*6/uL (ref 3.80–5.20)
RDW: 17.5 % — AB (ref 11.5–14.5)
WBC: 11.8 10*3/uL — ABNORMAL HIGH (ref 3.6–11.0)

## 2013-12-30 LAB — BASIC METABOLIC PANEL
BUN: 21 mg/dL (ref 6–23)
BUN: 21 mg/dL (ref 6–23)
CALCIUM: 7.3 mg/dL — AB (ref 8.4–10.5)
CHLORIDE: 110 meq/L (ref 96–112)
CHLORIDE: 112 meq/L (ref 96–112)
CO2: 13 meq/L — AB (ref 19–32)
CO2: 18 meq/L — AB (ref 19–32)
Calcium: 7.1 mg/dL — ABNORMAL LOW (ref 8.4–10.5)
Creatinine, Ser: 1 mg/dL (ref 0.50–1.10)
Creatinine, Ser: 0.84 mg/dL (ref 0.50–1.10)
GFR calc Af Amer: 77 mL/min — ABNORMAL LOW (ref >90)
GFR calc Af Amer: >90 mL/min (ref >90)
GFR calc non Af Amer: 66 mL/min — ABNORMAL LOW (ref >90)
GFR calc non Af Amer: 81 mL/min — ABNORMAL LOW (ref >90)
GLUCOSE: 145 mg/dL — AB (ref 70–99)
Glucose, Bld: 224 mg/dL — ABNORMAL HIGH (ref 70–99)
POTASSIUM: 4.2 meq/L (ref 3.7–5.3)
Potassium: 3.2 mEq/L — ABNORMAL LOW (ref 3.7–5.3)
SODIUM: 145 meq/L (ref 137–147)
Sodium: 142 mEq/L (ref 137–147)

## 2013-12-30 LAB — CARBOXYHEMOGLOBIN
Carboxyhemoglobin: 0.7 % (ref 0.5–1.5)
Methemoglobin: 1 % (ref 0.0–1.5)
O2 Saturation: 56.8 %
TOTAL HEMOGLOBIN: 11.5 g/dL — AB (ref 12.0–16.0)

## 2013-12-30 LAB — PROTIME-INR
INR: 1.2
INR: 3.69 — AB (ref 0.00–1.49)
PROTHROMBIN TIME: 15.2 s — AB (ref 11.5–14.7)
PROTHROMBIN TIME: 35.2 s — AB (ref 11.6–15.2)

## 2013-12-30 LAB — TROPONIN I: Troponin-I: 0.23 ng/mL — ABNORMAL HIGH

## 2013-12-30 LAB — MRSA PCR SCREENING: MRSA BY PCR: NEGATIVE

## 2013-12-30 LAB — APTT
APTT: 184 s — AB (ref 24–37)
Activated PTT: 47.2 secs — ABNORMAL HIGH (ref 23.6–35.9)

## 2013-12-30 LAB — LACTIC ACID, PLASMA: LACTIC ACID, VENOUS: 6.4 mmol/L — AB (ref 0.5–2.2)

## 2013-12-30 SURGERY — LEFT HEART CATHETERIZATION WITH CORONARY ANGIOGRAM
Anesthesia: LOCAL

## 2013-12-30 MED ORDER — DOPAMINE-DEXTROSE 3.2-5 MG/ML-% IV SOLN
2.0000 ug/kg/min | INTRAVENOUS | Status: DC
  Administered 2013-12-30: 5 ug/kg/min via INTRAVENOUS

## 2013-12-30 MED ORDER — BIOTENE DRY MOUTH MT LIQD
1.0000 "application " | Freq: Four times a day (QID) | OROMUCOSAL | Status: DC
  Administered 2013-12-30: 15 mL via OROMUCOSAL

## 2013-12-30 MED ORDER — SODIUM BICARBONATE 8.4 % IV SOLN
INTRAVENOUS | Status: AC
  Filled 2013-12-30: qty 100

## 2013-12-30 MED ORDER — SODIUM CHLORIDE 0.9 % IV SOLN
1.0000 ug/kg/min | INTRAVENOUS | Status: DC
  Administered 2013-12-30: 1 ug/kg/min via INTRAVENOUS
  Filled 2013-12-30: qty 20

## 2013-12-30 MED ORDER — NOREPINEPHRINE BITARTRATE 1 MG/ML IJ SOLN
2.0000 ug/min | INTRAVENOUS | Status: DC
  Administered 2013-12-30: 20 ug/min via INTRAVENOUS
  Filled 2013-12-30: qty 16

## 2013-12-30 MED ORDER — CLOPIDOGREL BISULFATE 75 MG PO TABS: 75.0000 mg | ORAL_TABLET | Freq: Every day | ORAL | Status: DC

## 2013-12-30 MED ORDER — ASPIRIN 300 MG RE SUPP
300.0000 mg | RECTAL | Status: DC
  Filled 2013-12-30: qty 1

## 2013-12-30 MED ORDER — PANTOPRAZOLE SODIUM 40 MG IV SOLR: 40.0000 mg | INTRAVENOUS | Status: DC

## 2013-12-30 MED ORDER — SODIUM CHLORIDE 0.9 % IV SOLN: 1.7500 mg/kg/h | INTRAVENOUS | Status: AC

## 2013-12-30 MED ORDER — CISATRACURIUM BOLUS VIA INFUSION
0.1000 mg/kg | Freq: Once | INTRAVENOUS | Status: AC
  Administered 2013-12-30: 6.4 mg via INTRAVENOUS
  Filled 2013-12-30: qty 7

## 2013-12-30 MED ORDER — SODIUM CHLORIDE 0.9 % IV SOLN
1.0000 mg/h | INTRAVENOUS | Status: DC
  Administered 2013-12-30: 2 mg/h via INTRAVENOUS
  Filled 2013-12-30: qty 10

## 2013-12-30 MED ORDER — PROPOFOL 10 MG/ML IV EMUL
5.0000 ug/kg/min | INTRAVENOUS | Status: DC
  Administered 2013-12-30: 70 ug/kg/min via INTRAVENOUS

## 2013-12-30 MED ORDER — HEPARIN (PORCINE) IN NACL 2-0.9 UNIT/ML-% IJ SOLN
INTRAMUSCULAR | Status: AC
  Filled 2013-12-30: qty 500

## 2013-12-30 MED ORDER — SODIUM CHLORIDE 0.9 % IV SOLN
25.0000 ug/h | INTRAVENOUS | Status: DC
  Administered 2013-12-30: 50 ug/h via INTRAVENOUS
  Filled 2013-12-30: qty 50

## 2013-12-30 MED ORDER — ASPIRIN EC 81 MG PO TBEC: 81.0000 mg | DELAYED_RELEASE_TABLET | Freq: Every day | ORAL | Status: DC

## 2013-12-30 MED ORDER — NOREPINEPHRINE BITARTRATE 1 MG/ML IJ SOLN
0.5000 ug/min | INTRAMUSCULAR | Status: DC
  Filled 2013-12-30: qty 4

## 2013-12-30 MED ORDER — FENTANYL BOLUS VIA INFUSION
50.0000 ug | INTRAVENOUS | Status: DC | PRN
  Filled 2013-12-30: qty 50

## 2013-12-30 MED ORDER — BIOTENE DRY MOUTH MT LIQD: 15.0000 mL | Freq: Four times a day (QID) | OROMUCOSAL | Status: DC

## 2013-12-30 MED ORDER — SODIUM CHLORIDE 0.9 % IV SOLN: 2000.0000 mL | Freq: Once | INTRAVENOUS | Status: DC

## 2013-12-30 MED ORDER — MIDAZOLAM BOLUS VIA INFUSION
2.0000 mg | INTRAVENOUS | Status: DC | PRN
  Filled 2013-12-30: qty 2

## 2013-12-30 MED ORDER — ARTIFICIAL TEARS OP OINT: 1.0000 "application " | TOPICAL_OINTMENT | Freq: Three times a day (TID) | OPHTHALMIC | Status: DC

## 2013-12-30 MED ORDER — HEPARIN (PORCINE) IN NACL 2-0.9 UNIT/ML-% IJ SOLN
INTRAMUSCULAR | Status: AC
Start: 2013-12-30 — End: 2013-12-30
  Filled 2013-12-30: qty 1500

## 2013-12-30 MED ORDER — PROPOFOL 10 MG/ML IV EMUL
INTRAVENOUS | Status: AC
  Filled 2013-12-30: qty 100

## 2013-12-30 MED ORDER — BIVALIRUDIN 250 MG IV SOLR
INTRAVENOUS | Status: AC
  Filled 2013-12-30: qty 250

## 2013-12-30 MED ORDER — SODIUM CHLORIDE 0.9 % IV SOLN
0.2500 mg/kg/h | INTRAVENOUS | Status: DC
  Filled 2013-12-30 (×2): qty 250

## 2013-12-30 MED ORDER — CHLORHEXIDINE GLUCONATE 0.12 % MT SOLN: 15.0000 mL | Freq: Two times a day (BID) | OROMUCOSAL | Status: DC

## 2013-12-30 MED ORDER — AMIODARONE HCL IN DEXTROSE 360-4.14 MG/200ML-% IV SOLN: 60.0000 mg/h | INTRAVENOUS | Status: DC

## 2013-12-30 MED ORDER — CISATRACURIUM BOLUS VIA INFUSION
0.0500 mg/kg | INTRAVENOUS | Status: DC | PRN
  Filled 2013-12-30: qty 4

## 2013-12-30 MED ORDER — LIDOCAINE HCL (PF) 1 % IJ SOLN
INTRAMUSCULAR | Status: AC
  Filled 2013-12-30: qty 30

## 2013-12-30 MED ORDER — AMIODARONE HCL IN DEXTROSE 360-4.14 MG/200ML-% IV SOLN: 30.0000 mg/h | INTRAVENOUS | Status: DC

## 2013-12-30 MED ORDER — NITROGLYCERIN 0.2 MG/ML ON CALL CATH LAB
INTRAVENOUS | Status: AC
  Filled 2013-12-30: qty 1

## 2013-12-30 MED ORDER — SODIUM CHLORIDE 0.9 % IV SOLN
INTRAVENOUS | Status: DC
  Administered 2013-12-30: 12:00:00 via INTRAVENOUS

## 2013-12-30 MED FILL — Medication: Qty: 1 | Status: AC

## 2013-12-31 LAB — POCT I-STAT, CHEM 8
BUN: 17 mg/dL (ref 6–23)
Calcium, Ion: 0.81 mmol/L — ABNORMAL LOW (ref 1.12–1.23)
Chloride: 113 meq/L — ABNORMAL HIGH (ref 96–112)
Creatinine, Ser: 0.8 mg/dL (ref 0.50–1.10)
Glucose, Bld: 122 mg/dL — ABNORMAL HIGH (ref 70–99)
HCT: 30 % — ABNORMAL LOW (ref 36.0–46.0)
Hemoglobin: 10.2 g/dL — ABNORMAL LOW (ref 12.0–15.0)
Potassium: 3.1 meq/L — ABNORMAL LOW (ref 3.7–5.3)
Sodium: 157 meq/L — ABNORMAL HIGH (ref 137–147)
TCO2: 25 mmol/L (ref 0–100)

## 2013-12-31 MED FILL — Dopamine in Dextrose 5% Inj 3.2 MG/ML: INTRAVENOUS | Qty: 250 | Status: AC

## 2014-01-11 NOTE — Progress Notes (Signed)
Respiratory therapy note- Placed on original setting per Care[facility] report. CCM changed rate to 30 with f/u ABG done. No other changes, continue to monitor.

## 2014-01-11 NOTE — Progress Notes (Signed)
Echocardiogram 2D Echocardiogram limited has been performed.  Jenaro Souder 2014-01-04, 1:41 PM

## 2014-01-11 NOTE — Procedures (Signed)
Code Blue Note  Called by bedside RN, patient is v-fib, shocked twice and ACLS protocol was followed, had return of spontaneous circulation. Spoke with husband that requested we do all we can.  Upon return to room, patient remained on levophed and was given amiodarone 300.  After my arrival, patient developed V-fib again, ACLS protocol was followed and patient was shocked multiple times.  V-fib was refractory at that point and after 5 additional shocks decision was made to declare patient.  Patient was declared dead and I notified the husband in the conference room.  Alyson Reedy, M.D. [facility]monary/Critical Care Medicine. Pager: [phone]. After hours pager: [phone].

## 2014-01-11 NOTE — Progress Notes (Signed)
01/01/2014 1445  After several attempts to resuscitate pt the code was called at 1445 by Dr. Molli Knock and Bensimhon. Pt pronounced at 1445.  Comfort given to pt's husband by [place] and staff. Medical exam notified and explained to husband's understanding. Brooke Payes, Linnell Fulling

## 2014-01-11 NOTE — Progress Notes (Signed)
Chaplain responded to code blue. Pt's husband was in lobby and was tearful and shaky upon hearing news that pt was once again being resuscitated. Chaplain asked if he would like to go back for code and he said no. Chaplain asked if he would like company and he said no. Chaplain remained in 2H in case husband did decide to accept support. Chaplain sat with husband while he received news of death but he again asked for privacy. Chaplain later obtained next of kin information.

## 2014-01-11 NOTE — Care Management Note (Signed)
    Page 1 of 1   03-Jan-2014     11:28:48 AM   CARE MANAGEMENT NOTE Jan 03, 2014  Patient:  Kristin Coleman, Kristin Coleman   Account Number:  [id number]  Date Initiated:  2014/01/03  Documentation initiated by:  Junius Creamer  Subjective/Objective Assessment:   adm w cardiac arrest, vent     Action/Plan:   pcp dr Rolm Gala   Anticipated DC Date:     Anticipated DC Plan:           Choice offered to / List presented to:             Status of service:   Medicare Important Message given?   (If response is "NO", the following Medicare IM given date fields will be blank) Date Medicare IM given:   Date Additional Medicare IM given:    Discharge Disposition:    Per UR Regulation:  Reviewed for med. necessity/level of care/duration of stay  If discussed at Long Length of Stay Meetings, dates discussed:    Comments:

## 2014-01-11 NOTE — Progress Notes (Signed)
Called by bedside RN, husband had some questions.  Attempted to place a radial a-line first to be able to take introducer out without success.  Patient's vitals were critical but stable on 10 mcg of levophed.  Spoke with husband who insisted on doing all we can.  RN was notified.  Shortly after, a code blue was called.  Please see code note for details.  Additional CC time of 45 min.  Alyson Reedy, M.D. [facility] Pulmonary/Critical Care Medicine. Pager: [phone]. After hours pager: [phone].

## 2014-01-11 NOTE — Consult Note (Signed)
ANTICOAGULATION CONSULT NOTE - Initial Consult  Pharmacy Consult for bivalirudin Indication: ACS/PCI  Allergies  Allergen Reactions  . Amoxicillin   . Flexeril [Cyclobenzaprine]   . Latex   . Levaquin [Levofloxacin In D5w]   . Neurontin [Gabapentin]   . Ranexa [Ranolazine]   . Sotalol   . Sulfa Antibiotics     Patient Measurements: Weight: 141 lb 1.5 oz (64 kg)  Vital Signs:    Labs: No results found for this basename: HGB, HCT, PLT, APTT, LABPROT, INR, HEPARINUNFRC, CREATININE, CKTOTAL, CKMB, TROPONINI,  in the last 72 hours  CrCl is unknown because no creatinine reading has been taken and the patient has no height on file.   Medical History: Past Medical History  Diagnosis Date  . Coronary artery disease   . GERD (gastroesophageal reflux disease)   . Fibromyalgia   . Depression   . Hypertension   . Kidney stones   . AAA (abdominal aortic aneurysm)   . Hiatal hernia   . Hyperlipidemia     Medications:  Scheduled:  . sodium chloride  2,000 mL Intravenous Once  . antiseptic oral rinse  1 application Mouth Rinse QID  . artificial tears  1 application Both Eyes 3 times per day  . [START ON 12/29/2013] aspirin EC  81 mg Oral Daily  . aspirin  300 mg Rectal NOW  . chlorhexidine  15 mL Mouth/Throat BID  . cisatracurium  0.1 mg/kg Intravenous Once  . [START ON 12/21/2013] clopidogrel  75 mg Oral Q breakfast  . pantoprazole (PROTONIX) IV  40 mg Intravenous Q24H    Assessment: 48 yo F s/p PCI for STEMI. Pharmacy consulted to dose Angiomax which is be dosed at full dose for 4 hours.  Goal of Therapy:  Appropriate dosing   Plan:  - Bivalirudin 1.75 mg/kg/hr to run for 4 hours - F/u further anticoagulation plans if necessary  Beatrix Breece C. Alizae Bechtel, PharmD Clinical Pharmacist-Resident Pager: [phone] Pharmacy: [phone] January 23, 2014 11:31 AM

## 2014-01-11 NOTE — Progress Notes (Signed)
  Responded to Code Blue.   48 y/o woman with severe CAD s/p multiple stents transferred to [facility] from [facility] after cardiac arrest with prolonged resuscitation (~45 mins). Brought to cath lab and underwent placement of 2 more stents. Started on cooling protocol.   Care provided in conjunction with CCM team. Developed VT/VF. Immediate CPR initiated. Shocked multiple times. Treated with multiple rounds of epinephrine, bicarb. Amiodarone started. Levophed increased.   We had return of spontaneous circulation briefly then developed refractory VF. Code Called at 245pm.   Family notified by CCM team.  CCT = 35 minutes   Avry Roedl,MD 3:01 PM

## 2014-01-11 NOTE — H&P (Addendum)
Kristin Coleman is an 48 y.o. female.    Primary Cardiologist:  PCP: Kristin Pilar, MD  Chief Complaint: cardiac arrest HPI: 48 year old woman was found down at home today.  Had been down approx. 45 min prior to resuscitation she was transferred from [facility] to  [facility] for STEMI post arrest.  She was on levophed, dopamine and intubated and transported to cath lab for emergent cath.   Pt was unresponsive.    She has hx of prior CAD with hx of 7 stents at [facility].  Artic Sun (hypthermic) protocol was started with CCM.   Past Medical History  Diagnosis Date  . Coronary artery disease   . GERD (gastroesophageal reflux disease)   . Fibromyalgia   . Depression   . Hypertension   . Kidney stones   . AAA (abdominal aortic aneurysm)   . Hiatal hernia   . Hyperlipidemia     Past Surgical History  Procedure Laterality Date  . Cardiac catheterization  2014    [facility]  . Cardiac catheterization  2014    [facility]; EF 55%    No family history on file. Though she herself has significant CAD.  Social History:  has no tobacco, alcohol, and drug history on file.  Allergies:  Allergies  Allergen Reactions  . Amoxicillin   . Flexeril [Cyclobenzaprine]   . Latex   . Levaquin [Levofloxacin In D5w]   . Neurontin [Gabapentin]   . Ranexa [Ranolazine]   . Sotalol   . Sulfa Antibiotics     Medications Prior to Admission  Medication Sig Dispense Refill  . amLODipine (NORVASC) 5 MG tablet Take 5 mg by mouth daily.      Marland Kitchen aspirin 81 MG tablet Take 81 mg by mouth daily.      Marland Kitchen atorvastatin (LIPITOR) 80 MG tablet Take 80 mg by mouth daily.      . clopidogrel (PLAVIX) 75 MG tablet Take 75 mg by mouth daily.      . enalapril (VASOTEC) 10 MG tablet Take 10 mg by mouth 2 (two) times daily.      . famotidine (PEPCID) 40 MG tablet Take 40 mg by mouth 2 (two) times daily.      . metoprolol succinate (TOPROL-XL) 25 MG 24 hr tablet Take 25 mg by mouth daily.      .  nitroGLYCERIN (NITROSTAT) 0.4 MG SL tablet Place 0.4 mg under the tongue every 5 (five) minutes as needed for chest pain.      Marland Kitchen omeprazole (PRILOSEC) 20 MG capsule Take 20 mg by mouth 2 (two) times daily.      Marland Kitchen oxyCODONE-acetaminophen (PERCOCET) 7.5-325 MG per tablet Take 1 tablet by mouth every 4 (four) hours as needed for pain.      . ropinirole (REQUIP) 5 MG tablet Take 5 mg by mouth at bedtime.      . traMADol (ULTRAM) 50 MG tablet Take 50 mg by mouth every 4 (four) hours as needed for pain.        Results for orders placed during the hospital encounter of 2014/01/14 (from the past 48 hour(s))  BASIC METABOLIC PANEL     Status: Abnormal   Collection Time    2014/01/14 11:12 AM      Result Value Ref Range   Sodium 142  137 - 147 mEq/L   Potassium 4.2  3.7 - 5.3 mEq/L   Chloride 110  96 - 112 mEq/L  CO2 13 (*) 19 - 32 mEq/L   Glucose, Bld 224 (*) 70 - 99 mg/dL   BUN 21  6 - 23 mg/dL   Creatinine, Ser 1.00  0.50 - 1.10 mg/dL   Calcium 7.1 (*) 8.4 - 10.5 mg/dL   GFR calc non Af Amer 66 (*) >90 mL/min   GFR calc Af Amer 77 (*) >90 mL/min   Comment: (NOTE)     The eGFR has been calculated using the CKD EPI equation.     This calculation has not been validated in all clinical situations.     eGFR's persistently <90 mL/min signify possible Chronic Kidney     Disease.  PROTIME-INR     Status: Abnormal   Collection Time    01-26-2014 11:12 AM      Result Value Ref Range   Prothrombin Time 35.2 (*) 11.6 - 15.2 seconds   INR 3.69 (*) 0.00 - 1.49  APTT     Status: Abnormal   Collection Time    01-26-14 11:12 AM      Result Value Ref Range   aPTT 184 (*) 24 - 37 seconds   Comment:            IF BASELINE aPTT IS ELEVATED,     SUGGEST PATIENT RISK ASSESSMENT     BE USED TO DETERMINE APPROPRIATE     ANTICOAGULANT THERAPY.  TROPONIN I     Status: Abnormal   Collection Time    01/26/2014 11:12 AM      Result Value Ref Range   Troponin I >20.00 (*) <0.30 ng/mL   Comment:            Due to  the release kinetics of cTnI,     a negative result within the first hours     of the onset of symptoms does not rule out     myocardial infarction with certainty.     If myocardial infarction is still suspected,     repeat the test at appropriate intervals.     CRITICAL RESULT CALLED TO, READ BACK BY AND VERIFIED WITH:     BRODY J RN January 26, 2014 1356 COSTELLO B  LACTIC ACID, PLASMA     Status: Abnormal   Collection Time    2014-01-26 11:12 AM      Result Value Ref Range   Lactic Acid, Venous 6.4 (*) 0.5 - 2.2 mmol/L  GLUCOSE, CAPILLARY     Status: Abnormal   Collection Time    01-26-2014 11:36 AM      Result Value Ref Range   Glucose-Capillary 161 (*) 70 - 99 mg/dL  CARBOXYHEMOGLOBIN     Status: Abnormal   Collection Time    01-26-14 12:45 PM      Result Value Ref Range   Total hemoglobin 11.5 (*) 12.0 - 16.0 g/dL   O2 Saturation 56.8     Carboxyhemoglobin 0.7  0.5 - 1.5 %   Methemoglobin 1.0  0.0 - 1.5 %  BLOOD GAS, ARTERIAL     Status: Abnormal   Collection Time    2014/01/26 12:45 PM      Result Value Ref Range   FIO2 1.00     Delivery systems VENTILATOR     Mode ASSIST CONTROL     VT 500     Rate 30     Peep/cpap 5.0     pH, Arterial 7.397  7.350 - 7.450   pCO2 arterial 22.9 (*) 35.0 - 45.0 mmHg  pO2, Arterial 223.0 (*) 80.0 - 100.0 mmHg   Bicarbonate 13.9 (*) 20.0 - 24.0 mEq/L   TCO2 14.7  0 - 100 mmol/L   Acid-base deficit 10.2 (*) 0.0 - 2.0 mmol/L   O2 Saturation 99.6     Patient temperature 97.0     Collection site A-LINE     Sample type ARTERIAL DRAW     Allens test (pass/fail) PASS  PASS  GLUCOSE, CAPILLARY     Status: Abnormal   Collection Time    12/18/2013 12:58 PM      Result Value Ref Range   Glucose-Capillary 184 (*) 70 - 99 mg/dL  MRSA PCR SCREENING     Status: None   Collection Time    12/15/2013  1:03 PM      Result Value Ref Range   MRSA by PCR NEGATIVE  NEGATIVE   Comment:            The GeneXpert MRSA Assay (FDA     approved for NASAL specimens      only), is one component of a     comprehensive MRSA colonization     surveillance program. It is not     intended to diagnose MRSA     infection nor to guide or     monitor treatment for     MRSA infections.  BASIC METABOLIC PANEL     Status: Abnormal   Collection Time    01/10/2014  1:12 PM      Result Value Ref Range   Sodium 145  137 - 147 mEq/L   Potassium 3.2 (*) 3.7 - 5.3 mEq/L   Comment: DELTA CHECK NOTED   Chloride 112  96 - 112 mEq/L   CO2 18 (*) 19 - 32 mEq/L   Glucose, Bld 145 (*) 70 - 99 mg/dL   BUN 21  6 - 23 mg/dL   Creatinine, Ser 0.84  0.50 - 1.10 mg/dL   Calcium 7.3 (*) 8.4 - 10.5 mg/dL   GFR calc non Af Amer 81 (*) >90 mL/min   GFR calc Af Amer >90  >90 mL/min   Comment: (NOTE)     The eGFR has been calculated using the CKD EPI equation.     This calculation has not been validated in all clinical situations.     eGFR's persistently <90 mL/min signify possible Chronic Kidney     Disease.  GLUCOSE, CAPILLARY     Status: Abnormal   Collection Time    12/27/2013  1:44 PM      Result Value Ref Range   Glucose-Capillary 150 (*) 70 - 99 mg/dL   No results found.  ROS: unable to obtain, family not present at beginning of cath.  Blood pressure 72/46, pulse 61, temperature 95.2 F (35.1 C), temperature source Core (Comment), resp. rate 30, height 5' 1" (1.549 m), weight 131 lb 2.8 oz (59.5 kg), SpO2 100.00%. PE: General:unresponsive,  On ventilator Skin:cold, intact HEENT:normocephalic, sclera clear, mucus membranes moist, OETT, OGT Neck:supple, no bruits  Heart:S1S2 RRR without murmur, gallup, rub or click Lungs:diminished without rales, rhonchi, or wheezes Abd:soft, non tender, + BS diminished, do not palpate liver spleen or masses Ext:no lower ext edema  Neuro:not responding, MAE   EKG deep t wave inversions in V1-4, ST elevation II.III. IV. Acute MI.  Assessment/Plan Principal Problem:   Cardiac arrest Active Problems:   STEMI (ST elevation myocardial  infarction)   Ventricular fibrillation   Acute respiratory failure  PLAN: Emergent cath and   admit to 2 H unit.  CCM to manage Artic Sun protocol. Continue pressors.  Dr. Kelly to manage further orders.  INGOLD,LAURA R Nurse Practitioner Certified [facility] HEARTCARE Pager [phone] or after 5pm or weekends call [phone] 12/25/2013, 3:37 PM    The patient is a 47-year-old female with known coronary artery disease and history of prior stenting to her circumflex and right coronary arteries. She developed a cardiac arrest at home and underwent prolonged CPR, defibrillation for recurrent ventricular fibrillation and ultimately was transported on inotropic support with levophed and dopamine and arrived to the cardiac catheterization laboratory sedated and intubated for emergent cardiac catheterization. There was  evidence for mild intermittent posturing. Catheterization revealed total proximal left circumflex occlusion as well as total  proximal RCA occlusion. She did have some collateralization to her distal RCA. She underwent successful reopening of her circumflex vessel with  PTCA and stenting with a 3.0x15 mm bare metal stent which resulted in a large left circumflex system with probable distal embolization to the very distal marginal branch. She also underwent successful reopening of her proximal to mid right coronary artery at site of prior stenting and underwent PTCA and stenting with a 3.0x33 mm bare-metal stent. Due to significant dye load and with additional collaterals to the RCA the more distal stent which was occluded was not opened. She left the catheterization laboratory with stable hemodynamics without evidence for arrhythmia on inotropic support and was transported to the CCU and the Artic Sun hypothermia protocol and management with the critical care team will be implemented.   Thomas A. Kelly, MD, FACC 01/02/2014 7:15 PM    

## 2014-01-11 NOTE — CV Procedure (Signed)
Kristin Coleman is a 48 y.o. female    [id number]  [id number] LOCATION:  FACILITY: [facility]  PHYSICIAN: Lennette Bihari, MD, [facility] [birth year]   DATE OF PROCEDURE:  12/25/2013    EMERGENT CARDIAC CATHETERIZATION/PERCUTANEOUS CORONARY INTERVENTION    HISTORY:  Ms. Kristin Coleman is a 48 year old female who has undergone multiple coronary procedures with stenting involving her circumflex coronary as well as RCA at multiple sites. She apparently developed an out-of-hospital cardiac arrest this a.m. was found to be in ventricular fibrillation. She underwent prolonged CPR in addition to multiple fibrillations. She presented to [facility] and was transferred to: Possible foreign emergent catheterization. Transportation was delayed due to the inclement weather and ice roads leading to delay in arrival to [facility]. She arrived, intubated, on inotropic therapy was transported to the cardiac catheterization laboratory for acute catheterization.   PROCEDURE:  Upon arrival to the catheterization laboratory, no history was obtainable. There was evidence for early posturing; the patient was intubated. She was prepped and shaved in usual fashion. A right femoral artery was punctured anteriorly and a 6 French sheath was inserted without difficulty. Diagnostic of FL4 and FR4 6 French catheters were used for selective angiography into the left coronary system and right coronary system. With the demonstration of total occlusion of the very proximal circumflex and near ostial occlusion of the RCA, it was felt that most likely the circumflex occlusion was more acute since she did have some collaterals to the distal RCA. Attention was then directed at opening up the acute circumflex occlusion. A 6 French XB LAD 3.5 guide was used. The patient was pretreated in the field with Plavix rectally 600 mg and aspirin. Angiomax bolus plus infusion was administered. ACT was therapeutic. An Asahi  medium wire was able  to cross the total circumflex occlusion which was occluded within the previous stented segment. Once flow was established,  the circumflex gave rise to a very large first marginal branch and then had a smaller distal circumflex marginal branch and now also supplied the distal RCA. Initial dilatation was done with a 2.5x12 mm Trek balloon dilated at 6 atmospheres for 21 second and then 11 atmospheres for 30 seconds.  The balloon was then removed and a bare-metal Vision 3.0x15 mm stent was carefully positioned between the takeoff of the ramus intermediate vessel ostially and ended just proximal to this large first marginal branch. The stent was dilated x2 up to 12 atmospheres. A 3.25x12 mm [place] Trek was used for post stent dilatation at 5, 12, and 13 atmospheres. This resulted in an excellent angiographic result at the site of initial site of occlusion. However, there was evidence for probable distal embolization of thrombus to the distal aspect of the more distal marginal branch.  At this point, the concern was also that the RCA may very well be an acutely occluded vessel and  attention was then directed to try to open up the right coronary artery.The RCA was occluded near the ostium, attempt was then made to open up the RCA.  Initially a 6 F RCA guide was used and the [employer] wire was inserted. This was only able to go slightly beyond the occlusion predominantly due to poor catheter back up. The guide was ultimately removed and exchanged for an XB RCA guide. A Choice PT moderate support wire was then inserted and was able to navigate through the occluded segment  in the RCA proximal stent.  A mini trek 2.0x12 mm was inserted and multiple  dilatations were made from the ostium to the mid RCA which allowed now for antegrade flow to go down a moderate-sized marginal branch as well as a branch was seen to supply the PDA vessel. There was still distal occlusion of the additional stent beyond the mid RCA and these  branches. A 2.75x15 mm balloon was then inserted and multiple dilatations were made from the midportion of the RCA to the ostium. A 3.0x33 mm MultiLink stent was then successfully deployed from this mid segment just proximal to the  side branch takeoffs in an area which had not been previously stented extending proximally into the previously placed stent. This was postdilated with a 3.25x20 mm [place] Trek balloon. Scout angiography again confirmed excellent result of the proximal segment with good flow to a moderate size marginal branch as well as to an  apparent PDA-like vessel was also arose from the mid segment. At this point we had received  information that the RCA was a chronic occluded vessel and with the proximal to mid vessel open the decision was made not to attempt further opening of the more distally placed RCA stent which was but may have some collaterals. The patient had already received a moderate amount of contrast. Scattered angiography confirmed a good angiographic result. The wire balloon and right catheter were then removed and a pigtail catheter was inserted and advanced into the left ventricle with a recording of left ventricular pressure. At this point the decision was made not to perform left ventriculography due to the contrast load already given and with plans for echo later today to reassess LV function if she stabilized. At this point, the arterial sheath was sutured in place. The patient was continued on full dose Angiomax with plans for to continue this for at least 4 hours at full dose. She left the catheterization laboratory with stable hemodynamics and was transported to the CCU to be seen by the critical care team for further management and initiation of the Artic Sun hypothermia protocol.     HEMODYNAMICS:   Central Aorta: 125/79   Left Ventricle: 125/24/34  ANGIOGRAPHY:  1. Left main: Angiographically mildly tapering mid distal vessel with  trifurcated into an LAD, a small  ramus intermediate vessel and left circumflex coronary artery 2. LAD: Further percent ostial narrowing. The LAD extending to the apex. Septal collaterals were present collateralizing the distal branches of the RCA 3. Ramus Intermediate: Vessel with 70% diffuse proximal narrowing 3. Left circumflex:  total occlusion in the very proximal segment of the previously placed proximal circumflex stent 4. Right coronary artery: Total occlusion near the ostium and a previously placed proximal stent. There also was a noncemented segment between the proximal and more distal RCA stented region    Following intervention to the left circumflex coronary artery, treated with PTCA and stenting with a 3.0x15 mm bare-metal vision stent with careful stent deployment immediately beyond the ramus intermediate vessel and just proximal to large obtuse marginal 1 vessel the 100% occlusion was reduced to 0%. The distal circumflex was a moderate size vessel. There was evidence for just embolization to a very small distal marginal branch which was treated with PTCA but still had distal occlusion. Plans were to continue anticoagulation to aid in distal recannulization.  Following intervention to the totally occluded right coronary artery which in retrospect was chronically totally occluded, the entire proximal stent was opened successfully as was the mid RCA prior to the takeoff on anterior RV marginal branch acute marginal right  vessel and a PDA branch. Following insertion of the 3.0x33 mm bare-metal stent this entire mid to ostial region was reduced to 0%. Beyond these 2 branches in the mid segment, the more distally placed RCA stent remained totally occluded.  Left ventricular pressures were recorded left ventriculography was not performed   IMPRESSION:  Out-of-hospital cardiac arrest requiring prolonged CPR resuscitation of greater than 45 minutes  and defibrillation, with angiographic evidence of occlusion of both the very  proximal circumflex and right coronary artery  40%-50% ostial narrowing of the left anterior descending artery  70% diffuse stenosis in a small ramus intermediate vessel  Successful PTCA/stenting the totally occluded circumflex with stenting with a 3.0x15 mm bare-metal stent percent occlusion reduced to 0% and  evidence for a large OM1 branch and smaller distal Lcx with probable distal embolization of a small distal marginal branch  Successful PTCA and stenting of the total occluded proximal near ostial RCA with opening up of the proximal and mid RCA with PTCA and stenting with a 3.0x33 mm bare-metal stent postdilated 3.25 mm and now reestablishment of antegrade flow throughout the marginal branch as well as a branch supplying a PDA-like vessel. The distal RCA stent remained totally occluded with evidence for collateralization distally.  Plan for Artic Sun hypothermia protocol  Lennette Bihari, MD, [facility] 12/27/2013 10:57 PM

## 2014-01-11 NOTE — Consult Note (Signed)
PULMONARY / CRITICAL CARE MEDICINE  Name: Kristin Coleman MRN: [id number] DOB: [birth year]    ADMISSION DATE:  2014/07/13 CONSULTATION DATE:  2014/07/13  REFERRING MD :  EDP PRIMARY SERVICE:  PCCM  CHIEF COMPLAINT:  Cardiac arrest  BRIEF PATIENT DESCRIPTION: 48 yo with past medical history of CAD / multiple stents [facility]/17 after suffering out of the hospital cardiac arrest.  SIGNIFICANT EVENTS / STUDIES:  2/17  Transferred from outside facility after suffering out of the hospital cardiac arrest 2/17  Cath lab >>>  LINES / TUBES: OETT 2/17 >>> OGT 2/17 >>> Foley 2/17 >>> LCS CVL 2/17 >>>  CULTURES:  ANTIBIOTICS:  The patient is encephalopathic and unable to provide history, which was obtained for available medical records.  HISTORY OF PRESENT ILLNESS:  48 yo with past medical history of CAD [facility] after suffering out of the hospital cardiac arrest. Apparently developed chest pain radiating to back x 1 hour, then EMS was called.  Up on arrival, ST elevations in inferior leads, hemodynamically stable.  En route to the hospital suffered VF arrest, resuscitated.    PAST MEDICAL HISTORY :  Past Medical History  Diagnosis Date  . Coronary artery disease   . GERD (gastroesophageal reflux disease)   . Fibromyalgia   . Depression   . Hypertension   . Kidney stones   . AAA (abdominal aortic aneurysm)   . Hiatal hernia   . Hyperlipidemia    Past Surgical History  Procedure Laterality Date  . Cardiac catheterization  2014    [facility]  . Cardiac catheterization  2014    [facility]; EF 55%   Prior to Admission medications   Medication Sig Start Date End Date Taking? Authorizing Provider  amLODipine (NORVASC) 5 MG tablet Take 5 mg by mouth daily.    Historical Provider, MD  aspirin 81 MG tablet Take 81 mg by mouth daily.    Historical Provider, MD  atorvastatin (LIPITOR) 80 MG tablet Take 80 mg by mouth  daily.    Historical Provider, MD  clopidogrel (PLAVIX) 75 MG tablet Take 75 mg by mouth daily.    Historical Provider, MD  enalapril (VASOTEC) 10 MG tablet Take 10 mg by mouth 2 (two) times daily.    Historical Provider, MD  famotidine (PEPCID) 40 MG tablet Take 40 mg by mouth 2 (two) times daily.    Historical Provider, MD  metoprolol succinate (TOPROL-XL) 25 MG 24 hr tablet Take 25 mg by mouth daily.    Historical Provider, MD  nitroGLYCERIN (NITROSTAT) 0.4 MG SL tablet Place 0.4 mg under the tongue every 5 (five) minutes as needed for chest pain.    Historical Provider, MD  omeprazole (PRILOSEC) 20 MG capsule Take 20 mg by mouth 2 (two) times daily.    Historical Provider, MD  oxyCODONE-acetaminophen (PERCOCET) 7.5-325 MG per tablet Take 1 tablet by mouth every 4 (four) hours as needed for pain.    Historical Provider, MD  ropinirole (REQUIP) 5 MG tablet Take 5 mg by mouth at bedtime.    Historical Provider, MD  traMADol (ULTRAM) 50 MG tablet Take 50 mg by mouth every 4 (four) hours as needed for pain.    Historical Provider, MD   Allergies  Allergen Reactions  . Amoxicillin   . Flexeril [Cyclobenzaprine]   . Latex   . Levaquin [Levofloxacin In D5w]   . Neurontin [Gabapentin]   . Ranexa [Ranolazine]   .  Sotalol   . Sulfa Antibiotics    FAMILY HISTORY:  No family history on file. SOCIAL HISTORY:  has no tobacco, alcohol, and drug history on file.  REVIEW OF SYSTEMS:  Unable to provide.  INTERVAL HISTORY:  VITAL SIGNS:   HEMODYNAMICS:   VENTILATOR SETTINGS:   INTAKE / OUTPUT: Intake/Output   None    PHYSICAL EXAMINATION: General:  Appears acutely ill, mechanically ventilated, synchronous Neuro:  Encephalopathic, nonfocal, cough / gag diminished HEENT:  PERRL, OETT / OGT Cardiovascular:  RRR, no m/r/g Lungs:  Bilateral diminished air entry, no w/r/r Abdomen:  Soft, nontender, bowel sounds diminished Musculoskeletal:  Moves all extremities, no edema Skin:   Intact  LABS: CBC No results found for this basename: WBC, HGB, HCT, PLT,  in the last 168 hours Coag's No results found for this basename: APTT, INR,  in the last 168 hours BMET No results found for this basename: NA, K, CL, CO2, BUN, CREATININE, GLUCOSE,  in the last 168 hours Electrolytes No results found for this basename: CALCIUM, MG, PHOS,  in the last 168 hours Sepsis Markers No results found for this basename: LATICACIDVEN, PROCALCITON, O2SATVEN,  in the last 168 hours ABG No results found for this basename: PHART, PCO2ART, PO2ART,  in the last 168 hours Liver Enzymes No results found for this basename: AST, ALT, ALKPHOS, BILITOT, ALBUMIN,  in the last 168 hours Cardiac Enzymes No results found for this basename: TROPONINI, PROBNP,  in the last 168 hours Glucose No results found for this basename: GLUCAP,  in the last 168 hours  Imaging: No results found.  ASSESSMENT / PLAN:  PULMONARY A:  Acute respiratory failure in setting of cardiac arrest P:   Goal SpO2>92, pH>7.30 Full mechanical support Daily SBT Ventilator bundle Trend ABG / CXR  CARDIOVASCULAR A:  S/p cardiac arrest Cardiogenic shock CAD P:  Goal MAP>85, HR>80 ASA / Plavix / Angiomax BB / ACEI contraindicated - shock Levophed gtt for goal MAP Dopamine gtt for goal HR Consider Dobutamine COOX Trend troponin / lactate TTE Transvenous pacer? IABP?  RENAL A:   Awaiting labs P:   Goal CVP>10 Trend BMP Fluids per Hypothermia protocol  GASTROINTESTINAL A:   Nutrition GI Px P:   NPO TF when off paralysis Protonix  HEMATOLOGIC A:  Awaiting labs P:  Trend CBC SCD for DVT Px APTT / INR  INFECTIOUS A:   No active issues P:   No intervention required  ENDOCRINE  A:   At risk for hyper / hypoglycemia P:   SSI  NEUROLOGIC A:   Acute encephalopathy Possible anoxia P:   Goal RASS 0 to -1 Daily WUA Fentanyl / Versed / Nimbex  I have personally obtained history,  examined patient, evaluated and interpreted laboratory and imaging results, reviewed medical records, formulated assessment / plan and placed orders.  CRITICAL CARE:  The patient is critically ill with multiple organ systems failure and requires high complexity decision making for assessment and support, frequent evaluation and titration of therapies, application of advanced monitoring technologies and extensive interpretation of multiple databases. Critical Care Time devoted to patient care services described in this note is 50 minutes.   Lonia Farber, MD Pulmonary and Critical Care Medicine [facility] Pager: [phone]  01/05/2014, 9:09 AM

## 2014-01-11 NOTE — Progress Notes (Signed)
Jan 15, 2014 1500  Fentanyl 200 mL and Versed 40 mL wasted in sink and witnessed by Sanjuan Dame RN

## 2014-01-11 DEATH — deceased

## 2014-02-01 DIAGNOSIS — I2510 Atherosclerotic heart disease of native coronary artery without angina pectoris: Secondary | ICD-10-CM | POA: Diagnosis present

## 2014-02-01 NOTE — Discharge Summary (Signed)
Expiration Note  Kristin Coleman MRN: [id number] DOB: 1966-10-01  Admit Date: 01-13-14 Time & Date of Death:  13-Jan-2014  @ 2:45 pm  Attending Physician: Dr. Tresa Endo PCP: Dr. Gavin Potters Consults: CCM  Dr. Molli Knock  Cause of Death: acute ST elevation MI with total occlusion of the LCX  Secondary Diagnosis:  V. Fib Principal Problem:   Cardiac arrest Active Problems:   STEMI (ST elevation myocardial infarction)   Ventricular fibrillation   Acute respiratory failure   Procedures: Jan 13, 2014  Cardiac cath by Dr. Tresa Endo 13-Jan-2014  Successful PTCA/stenting the totally occluded circumflex with stenting with a 3.0x15 mm bare-metal stent  Successful PTCA and stenting of the total occluded proximal near ostial RCA with opening up of the proximal and mid RCA with PTCA and stenting with a 3.0x33 mm bare-metal stent   Hostpital Course: 48 year old woman was found down at home January 13, 2014.  Had been down approx. 45 min prior to resuscitation she was transferred from [facility] to [facility] for STEMI, post arrest. She was on levophed, dopamine and intubated and transported to cath lab for emergent cath. Pt was unresponsive.   She has hx of prior CAD with hx of 7 stents at [facility]. Artic Sun (hypthermic) protocol was started with CCM.   Emergent cath revealed 100% stenosis of LCX and proximal, almost ostial RCA.  She underwent stenting of both vessels emergently by Dr. Tresa Endo.  She did have a more distal occlusion of RCA but this was not opened due to significant dye load.  She was on hypothermic protocol and transferred to CCU.     2D Echo:   Left ventricle: The cavity size was normal. The estimated ejection fraction was 55%.  - Atrial septum: There was increased thickness of the septum, consistent with lipomatous hypertrophy. No defector patent foramen ovale was identified   In CCU she became unstable developing V Fib, shocked X2 and ACLS protocol was followed with multiple rounds of epinephrine,  bicarb. Amiodarone started. Levophed increased.  She then developed refractory V fib and was shocked multiple times.  She received 5 additional shocks without return to normal rhythm.  She was declared dead at 2:45pm.  Family notified by the CCM team.    . Leone Brand, M.D., M.S. [facility] [address] [place], [place]  [address]  [phone]  02/01/2014 10:04 PM

## 2014-03-01 IMAGING — CT CT ABD-PELV W/ CM
2 of 5 series · 16 of 46 positions shown, 18 images · IV contrast (isovue)
Comparison: CT of the abdomen and pelvis October 23, 2013.

CLINICAL DATA: Left lower quadrant pain, possible diverticulitis.

EXAM:
CT ABDOMEN AND PELVIS WITH CONTRAST
TECHNIQUE: Multidetector CT imaging of the abdomen and pelvis was performed
using the standard protocol following bolus administration of
intravenous contrast.
CONTRAST:  100 cc of Isovue 370.

[Series 2: routine abd pel with · axial · 0.68mm/px · z∈[+301,+656]mm · 13 of 81 slices shown, 15 images]
[im 5/81  soft-tissue]
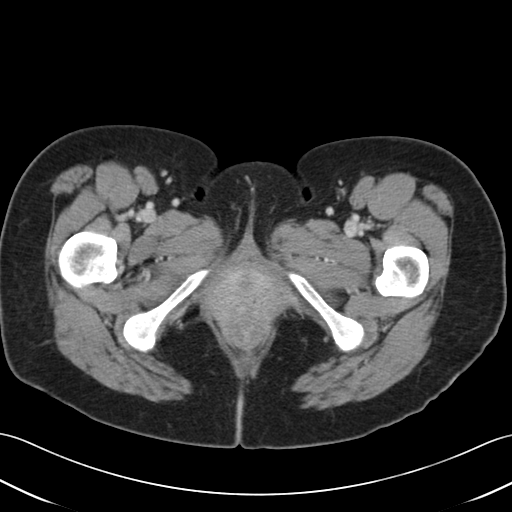
[im 5/81  bone]
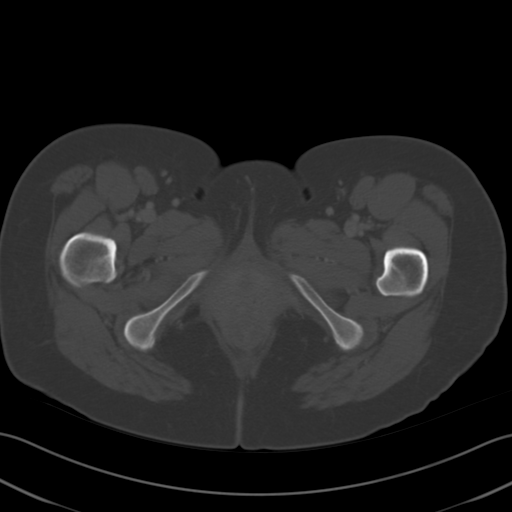
[im 13/81  soft-tissue]
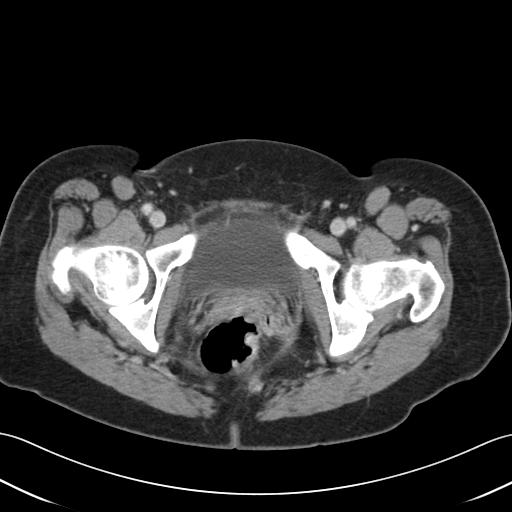
[im 17/81  soft-tissue]
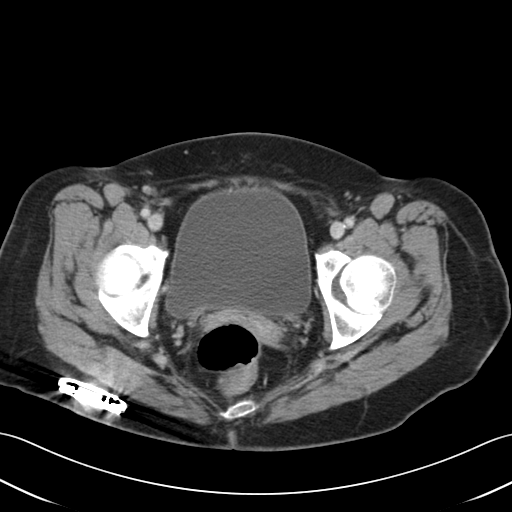
[im 22/81  soft-tissue]
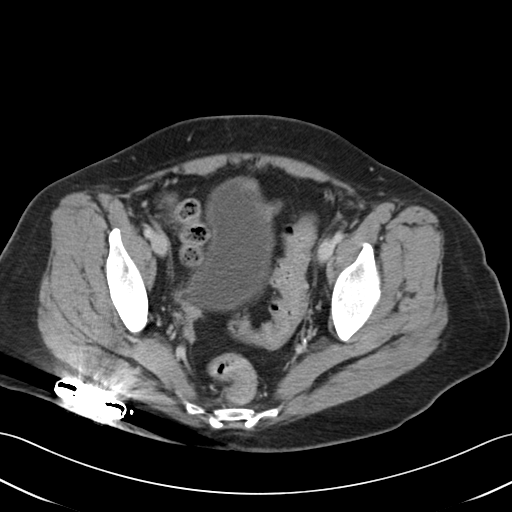
[im 30/81  soft-tissue]
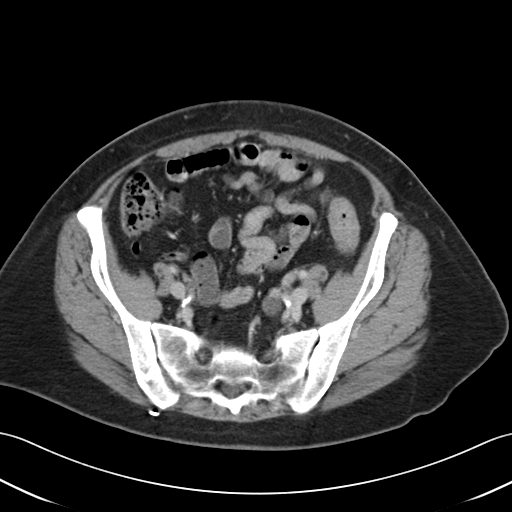
[im 34/81  soft-tissue]
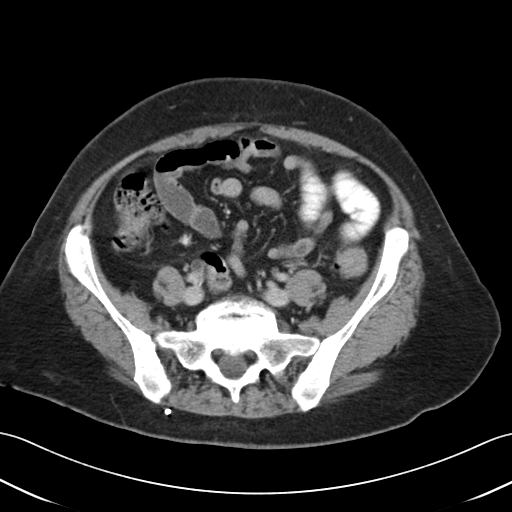
[im 43/81  soft-tissue]
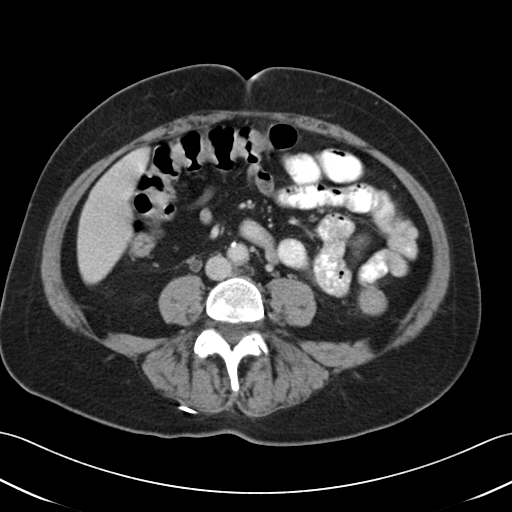
[im 47/81  soft-tissue]
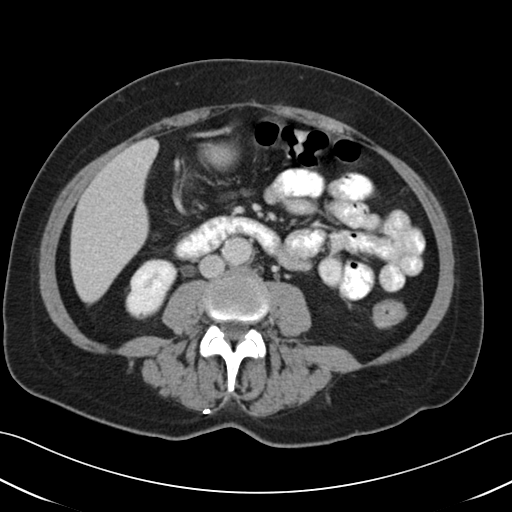
[im 51/81  soft-tissue]
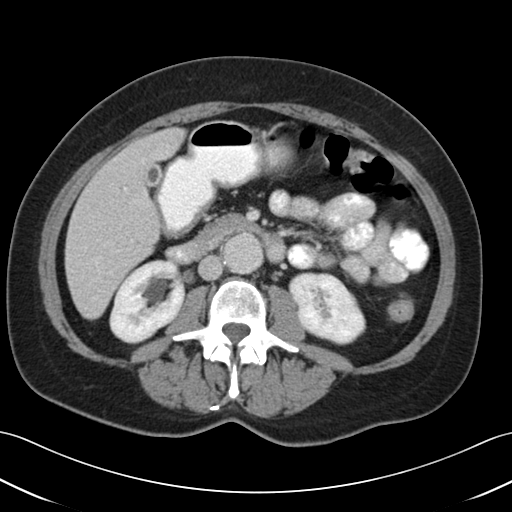
[im 51/81  bone]
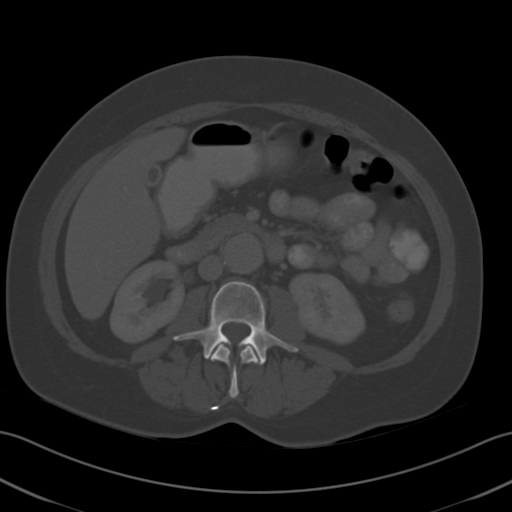
[im 59/81  soft-tissue]
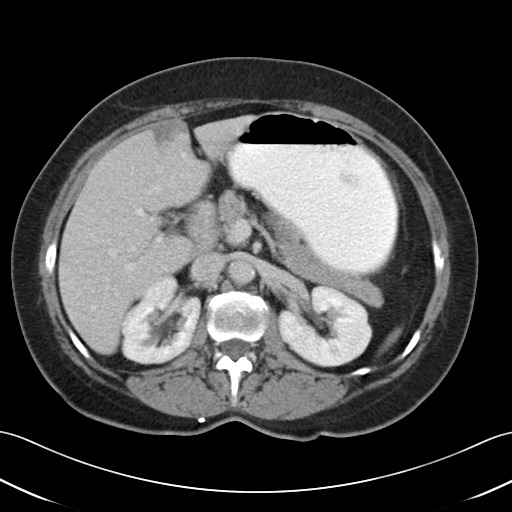
[im 64/81  soft-tissue]
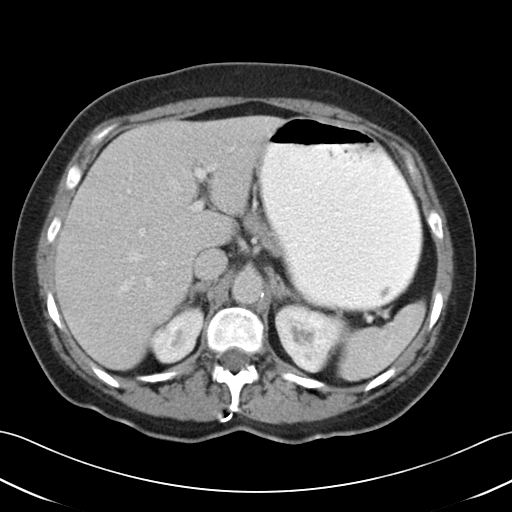
[im 68/81  soft-tissue]
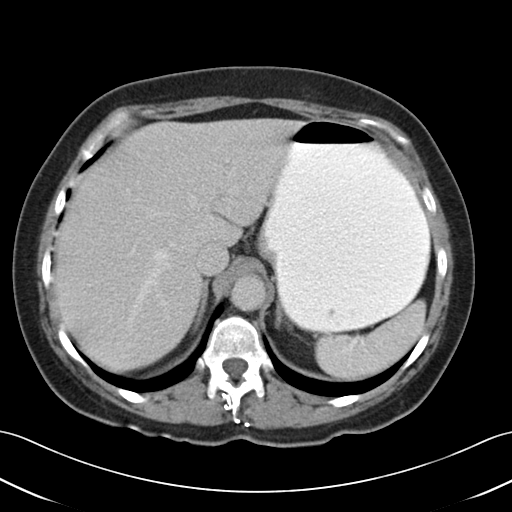
[im 76/81  soft-tissue]
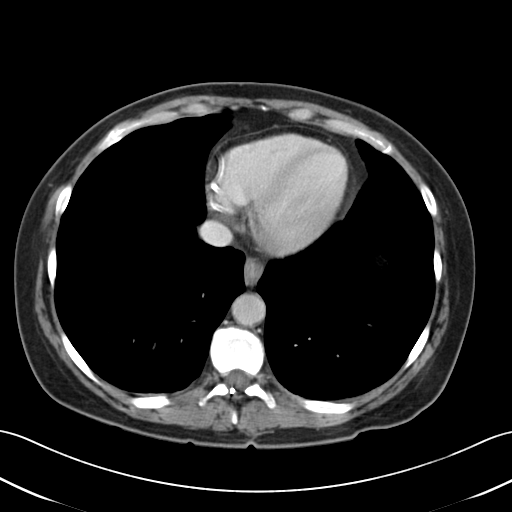

[Series 6: cor routine abd pel with · coronal · 0.89mm/px · 3 of 121 slices shown]
[im 41/121  soft-tissue]
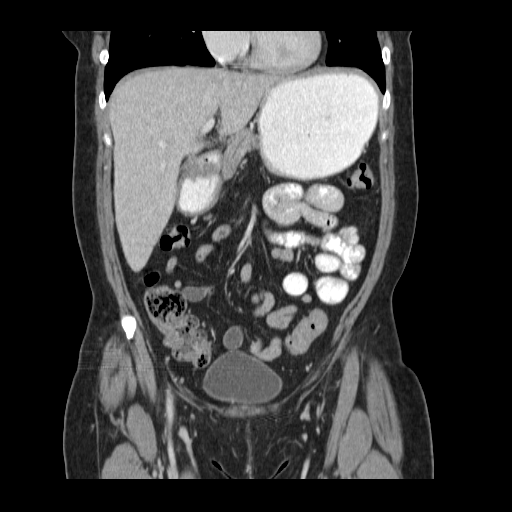
[im 54/121  soft-tissue]
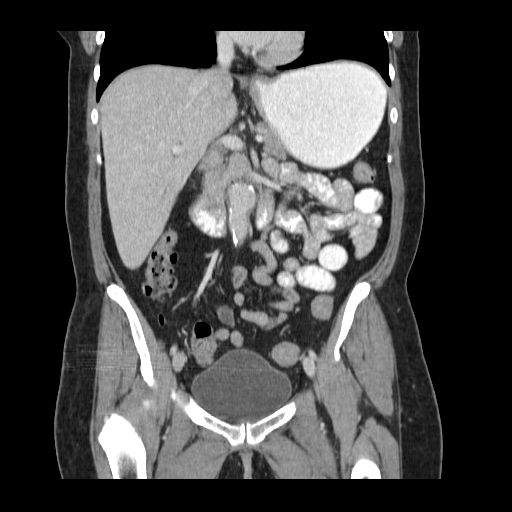
[im 67/121  soft-tissue]
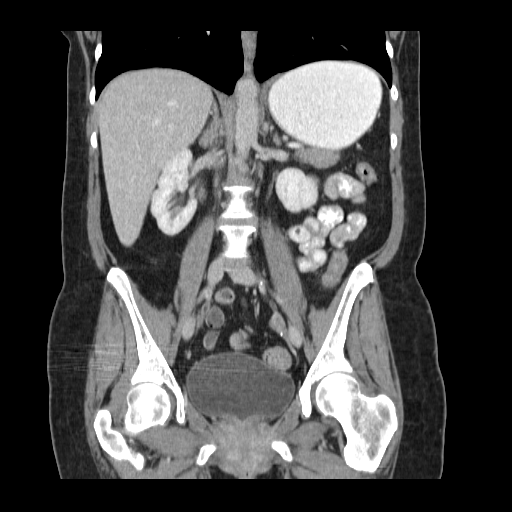

[16 of 46 positions shown; findings below may reference images not displayed]

FINDINGS: Included view of the lung bases are clear. Heart size is normal,
dense coronary artery calcification versus stent. Pericardium is
unremarkable.

Similar geographic hypodensity within the liver, about the falciform
ligament may reflect focal fatty infiltration or even possible
hemangioma as there is slight peripheral enhancement. The liver is
otherwise unremarkable. Gallbladder is contracted. The pancreas and
adrenal glands are unremarkable. Splenic granulomas.

The stomach, small and large bowel are normal in course and caliber
without inflammatory changes. Sigmoid diverticulosis without
superimposed inflammatory changes. Evaluation of the large bowel
limited as contrast has yet to reach the distal small bowel. Normal
appendix. No intraperitoneal free fluid nor free air.

Kidneys are orthotopic, demonstrating symmetric enhancement without
nephrolithiasis, hydronephrosis or renal masses. The unopacified
ureters are normal in course and caliber, however there is mild
uroepithelial enhancement, similar to prior CT. Urinary bladder is
partially distended and unremarkable.

Infrarenal aortic aneurysm measures 3 cm in AP dimension, with
peripheral intimal thickening and mild to moderate calcific
atherosclerosis. No lymphadenopathy by CT size criteria. Internal
reproductive organs are surgically absent. The soft tissues and
included osseous structures are nonsuspicious. Right gluteal spinal
stimulator, lower thoracic laminectomy with epidural lead.
IMPRESSION: Diverticulosis without CT findings of acute diverticulitis.

Mild symmetric uroepithelial enhancement, similar and is nonspecific
though could reflect urinary tract infection.

Infrarenal abdominal aortic aneurysm measuring 3 cm.

  By: Daphnee Harbison

## 2014-10-22 ENCOUNTER — Encounter (HOSPITAL_COMMUNITY): Payer: Self-pay | Admitting: Cardiovascular Disease

## 2015-03-02 NOTE — H&P (Signed)
PATIENT NAME:  Kristin Coleman, Kristin Coleman MR#:  [id number] DATE OF BIRTH:  1966-08-11  DATE OF ADMISSION:  06/11/2012  PRIMARY CARE PHYSICIAN: Dr. Rolm Gala    CHIEF COMPLAINT:  Right upper quadrant abdominal pain.   HISTORY OF PRESENT ILLNESS:  This is a 49 year old Caucasian female with chronic medical conditions notable for hypertension, coronary artery disease, chronic abdominal pain, and chronic diarrhea who presents with acute abdominal pain of four days duration. The patient notes that she was in her usual state of health until about four days ago when she felt like "my stomach didnt feel right for two days", no frank pains at that time.  She then developed sharp, aching, epigastric and right upper quadrant abdominal pain with intermittent radiation to her chest and to her back 2 days ago. She notes that initially the pain developed early in the morning and subsided without intervention then it recurred a few hours later and has been persistent since then. Food aggravates the pain slightly. She has associated nausea and diarrhea that developed at the same time. The diarrhea is characterized by multiple episodes of yellow loose stools, nonbloody, non-mucosy. There are no alleviating factors. She denies travel, sick contacts, eating any new foods. She admits to chills that began, again, a day prior to admission.   In the Emergency Department she has received pain management without any change in her pain. Pain level currently is a 7 out of 10.   Of note, the patient has had intermittent diarrhea and abdominal pain for about six months. The abdominal pain has been evaluated multiple times in the past and the work-up has only revealed biliary sludge. Reports diarrhea can last for one week, resolve and she would have 1 to 2 weeks of normal stools and then the diarrhea would recur. She has not been evaluated by a gastroenterologist for these symptoms.   PAST MEDICAL HISTORY:  1. History of myocardial  infarction.  2. History of hyperlipidemia.  3. Chronic low back pain status post stimulator placement.  4. Fibromyalgia.  5. Depression.  6. Gastroesophageal reflux disease. 7. Hypertension.  8. Nephrolithiasis.  9. Hiatal hernia  10. Hospitalization in 03/2012 for chest pain determined to be noncardiac etiology. She had a cardiac catheterization 03/20/2012. The cardiac catheterization revealed 100% RCA occlusion which is the same as previous. Left side was unchanged from previous as well.  11.  History of abdominal aortic aneurysm.  PAST SURGICAL HISTORY:  1. Partial hysterectomy with tubal ligation. 2. Ankle surgery.  3. Back surgery with nerve stimulator placement.  4. Cardiac catheterization.    ALLERGIES: The patient has multiple drug allergies including amoxicillin which causes nausea, vomiting, and diarrhea, Flexeril which causes muscle aches, Levaquin which causes swelling, Neurontin which causes rash and swelling, Ranexa which causes itching and swelling, sulfa drugs which cause hives, Toradol which causes muscle aches and restless legs,  latex which causes itching and rash,  and tape which causes a rash.  MEDICATIONS:  1. Promethazine 25 mg every six hours as needed for nausea, vomiting.  2. Pepcid AC 20 mg twice a day.  3. Omeprazole 20 mg twice a day.  4. Enalapril 10 mg twice a day.  5. Metoprolol succinate extended release, 1 tablet nightly.  6. Crestor 40 mg, 1 tablet nightly.  7. Carbidopa, levodopa 50 mg/200 mg 2 times a day. 8. Aspirin 81 mg daily. 9. Albuterol 2 puffs every four hours as needed for wheezing or shortness of breath.   FAMILY HISTORY: Notable  for sister that has lupus, Raynauds, and questionable Buerger's disease. Significant history of coronary artery disease in father and siblings. Father also had diabetes and hypertension. Mother had breast cancer and maternal aunt also had breast cancer.   SOCIAL HISTORY: Former smoker, quit five years ago. Denies  alcohol or illicit drug use. Lives at home with her husband.   REVIEW OF SYSTEMS:  CONSTITUTIONAL: Admits to chills. Denies frank fevers. Admits to fatigue. Denies weakness, weight loss, or weight gain.  EYES: She does have blurred vision, for which she wears reading glasses, but this is unchanged from baseline. Denies double vision, inflammation, or redness. ENT: Denies tinnitus, ear pain, or discharge. RESPIRATORY: She does note she has had a mild cough for about a week. She notes that she has had some dyspnea for about two weeks. CARDIOVASCULAR: She notes that she has intermittent sharp chest pains that last seconds. Denies orthopnea. Admits to dyspnea on exertion described as shortness of breath after walking about 50 steps, which is new. GI: Admits to nausea and diarrhea. Denies emesis. Admits to abdominal pain. Denies hematemesis, melena, or rectal bleeding. GU: Denies dysuria, hematuria, or frequency. GYN: She denies any vaginal discharge or airway irritation. ENDOCRINE: Denies increased sweating. INTEGUMENT: Denies rash. MUSCULOSKELETAL: Denies muscle aches or pains. NEURO: Denies numbness. PSYCH: Denies anxiety.  PHYSICAL EXAM:  VITAL SIGNS: Afebrile, temperature 98.2, heart rate 96, respirations 22, blood pressure 157/100, sating 96 percent on room air.   GENERAL: Healthy-appearing Caucasian female lying on a stretcher in minimal distress.   HEENT: Normocephalic, atraumatic. Extraocular muscles are intact. Anicteric sclerae. Pink conjunctivae. Normal external ears and nares. No oral lesions. Moist mucous membranes.   NECK: Supple without lymphadenopathy. No JVD.   CARDIAC: Normal S1, S2, regular rate and rhythm. No murmurs.   CHEST: Chest is clear to auscultation bilaterally. No wheezes, rales, or rhonchi.   ABDOMEN: There is tenderness over the right upper quadrant and epigastric region with a positive Murphy's sign. There is no rebound or guarding. There are normal bowel sounds.  Abdomen  is soft.  EXTREMITIES: There is no clubbing, cyanosis, or edema.   NEURO: No motor or sensory deficits.   SKIN: There is no jaundice. No skin rashes   LABORATORY DATA: CBC shows WBC count 14.3, hemoglobin 14.4, hematocrit 45.9, platelet count 348, MCV of 87.   Basic metabolic panel shows glucose 96, BUN of 6, creatinine 0.71, sodium 137, potassium 3.4, chloride 106, bicarbonate 23, calcium 9.6, with an anion gap of 8. LFTs show total bilirubin 0.3, alkaline phosphatase of 93, ALT 9, AST of 12, total protein 7.8, albumin of 4.1, serum osmolality 271. Lipase is 156.  Urinalysis is negative for glucose and bilirubin. There are trace ketones. No nitrites,  no leukocyte esterase.   Troponin is less than 0.02 times two sets.   EKG shows normal sinus rhythm at 97 beats per minute with p. mitrale likely suggesting enlarged left atrium. There are Q waves in V1 through V3 suggesting possible anterior infarct with good R-wave progression. There is low voltage in the limb leads. There are T-wave inversions in leads 2, 3, and aVF and flattened T waves diffusely, particularly in the lateral leads. The flattened T waves are a change from her prior EKG in 03/2012.   Ultrasound of the abdomen:  There is a stable 2.6 abdominal aortic aneurysm. The gallbladder is normal. There is no evidence of pericholecystic fluid, bile duct is normal diameter measuring 3.7 millimeters,  pancreas unremarkable. There is  no free intra-abdominal fluid. There is no evidence of sonographic Murphy sign. Overall no evidence of cholelithiasis nor cholecystitis.   ASSESSMENT AND PLAN: The patient is a 49 year old female presenting with acute on chronic abdominal pain associated with nausea and diarrhea with intermittent chest pain of unclear etiology.  1. Abdominal pain and diarrhea, unclear etiology. At this time initial work-up is negative for acute cholecystitis, pancreatitis. WIll check a HIDA scan to rule out any subacute cholestatic  disease given the fact that she does have leukocytosis. Another possibility would be perihepatitis from gonorrhea. Although the patient denies active symptoms she could be a carrier. We are going to check gonorrhea culture at this time. Given the chronicity of her symptoms and with these intermittent exacerbations, I am also concerned for irritable bowel syndrome will consult gastroenterology for any further recommendations. Will r/o enteritis and colitis with CT abdomen. Will check C. diff culture as well. She will be n.p.o. for now with p.r.n. pain and entiemetic medications. She does not have frank fevers nor does she have any jaundice, so I am less concerned for ascending cholangitis. I will check blood cultures, but will hold antibiotics. If she further degenerates, we can consider antibiotics at that time.  2. Hypokalemia, likely due to diarrhea. This will be repleted.  3. Intermittent chest pain. Most likely not to be cardiac at this point as cardiac enzymes have been negative for two sets. Likely is referred pain from an abdominal etiology. Her abdominal aneurysm is deemed stable on ultrasound. Monitor. 4. Coronary artery disease. The patient does report increased dyspnea on exertion. She likely could benefit from outpatient stress test given the atypical nature of her chest pain.  5. History of coronary artery disease. We will continue her on her home medications.  6. History of hypertension. The patient is currently stable. We will continue her home medications at this time.  7. History of hyperlipidemia. We will continue her Crestor. 8. Gastroesophageal reflux disease. We will continue her omeprazole.   CODE STATUS: FULL CODE.   TIME SPENT:  60 minutes.  ____________________________ Aurther Loft, DO aeo:bjt/slb D: 06/11/2012 05:48:31 ET T: 06/11/2012 09:42:06 ET JOB#: [id number]  cc: Aurther Loft, DO, <Dictator> Letitia Caul, MD Wess Baney E Henrietta Cieslewicz  DO ELECTRONICALLY SIGNED 06/12/2012 8:41

## 2015-03-02 NOTE — Discharge Summary (Signed)
PATIENT NAME:  Kristin Coleman, Kristin Coleman MR#:  [id number] DATE OF BIRTH:  27-Dec-1965  DATE OF ADMISSION:  06/11/2012 DATE OF DISCHARGE:  06/13/2012  DISCHARGE DIAGNOSES: 1. Chronic abdominal pain, diarrhea, likely secondary to irritable bowel syndrome. 2. Hypertension. 3. Hyperlipidemia. 4. Coronary artery disease. 5. Gastroesophageal reflux disease.   DISCHARGE MEDICATIONS:  1. Aspirin 81 mg daily. 2. Enalapril 10 mg p.o. b.i.d.  3. Metoprolol succinate 50 mg p.o. daily.  4. Carbidopa/levodopa 50/200 mg p.o. 1 tablet p.o. b.i.d.  5. Omeprazole 20 mg p.o. b.i.d.  6. Crestor 40 mg at bedtime. 7. Albuterol 2 puffs every four hours p.r.n.  8. Pepcid 20 mg p.o. b.i.d.   NEW MEDICATION:  1. Acetaminophen/hydrocodone 325/5, one tablet p.o. b.i.d. as needed for pain.  2. Dicyclomine 10 mg q.6 hours p.r.n.   DIET: Regular, low sodium, low fat diet.   ALLERGIES: She is allergic to multiple antibiotics, Levaquin, sulfa, amoxicillin and also allergic to tape, Latex, Xanax and Neurontin.   PRIMARY CARE PHYSICIAN:  Dr. Gavin Potters. She can follow with her in two weeks.   CONSULTATION: Gastroenterology consult with Dr. Niel Hummer.   HOSPITAL COURSE: 49 year old female with history of hyperlipidemia, coronary artery disease, myocardial infarction, fibromyalgia, depression, hypertension who came in because the patient was having abdominal pain. The patient mainly has upper abdominal pain and intermittent diarrhea. It has been going on for the past few months. The patient is admitted because of abdominal pain mainly in the epigastric region with some nausea. The patient was seen by Dr. Niel Hummer and she was admitted to the hospitalist service for abdominal pain. The patient's liver functions are within normal limits. Electrolytes were normal except potassium slightly low. Kidney function: creatinine 0.71. WBC slightly elevated at 14.3, but no evidence of infection. Urinalysis is negative. Troponins have been  negative, less than 0.02. Lipase 156. She had blood cultures which were negative. Abdominal ultrasound showed normal examination. Abdominal aorta showed the patient has aneurysm in the abdominal aorta, maximum 2.75, not significantly changed from 04/2012. The patient is C-diff negative. Hepatobiliary scan and HIDA scan negative with ejection fraction more than normal at 95%. Abdominal CT scan done which showed normal abdomen and pelvis. Normal small bowel and large bowel on the CT scan. The patient had a gastroenterology consult with Dr. Niel Hummer, had an endoscopy done yesterday morning and it showed exam of the esophagus normal. Small hiatal hernia. Localized mild inflammation with erythema in the gastric antrum. Biopsies were taken. Normal esophagus, gastritis and hiatal hernia. Probable irritable bowel syndrome. The patient will follow up with Dr. Niel Hummer in the office in six weeks. The patient was seen this morning, complains of some abdominal pain, but is able to tolerate the diet and requesting pain medications and nausea medicine to go home with. If we do not find any evidence of her abdominal pain and chronic diarrhea, we are thinking it could be due to irritable bowel syndrome given her history of depression and fibromyalgia. She needs to see gastroenterology for followup if needed as stool cultures negative, CT scan of abdomen is negative. She is stable to go home and she is tolerating diet. We are discharging her with dicyclomin ,as per  gastroenterologist and she can continue her medications.   TIME SPENT ON DISCHARGE PREPARATION: More than 50 minutes.   ____________________________ Katha Hamming, MD sk:ap D: 06/13/2012 09:49:19 ET T: 06/13/2012 13:15:41 ET JOB#: [id number]  cc: Katha Hamming, MD, <Dictator> Letitia Caul, MD Katha Hamming MD ELECTRONICALLY SIGNED 06/19/2012 14:21

## 2015-03-02 NOTE — Consult Note (Signed)
PATIENT NAME:  Kristin Coleman, Kristin Coleman MR#:  [id number] DATE OF BIRTH:  1966-01-23  DATE OF CONSULTATION:  06/11/2012  REFERRING PHYSICIAN:   CONSULTING PHYSICIAN:  Lurline Del, MD  PRIMARY CARE PHYSICIAN: Dr. Rolm Gala   REASON FOR CONSULTATION: Abdominal pain and diarrhea.   HISTORY OF PRESENT ILLNESS: 49 year old female with history of hypertension, coronary artery disease. Patient has history of chronic abdominal pain as well as chronic intermittent diarrhea. Patient presented to the hospital and was admitted with increase in her baseline abdominal pain for last several days. She normally has upper abdominal pain which may last for days. She also has history of intermittent diarrhea, each episode lasting for several days before turning into more regular bowel movements. These symptoms have been going on for quite some time. For the last couple of days patient is complaining of more epigastric pain that is new and radiates to her chest and back. She denies any vomiting. She is nauseated at times. Recently had diarrhea as worse as well without any blood. Patient had an upper GI endoscopy in 2009 which was unremarkable. Apparently she has never had a colonoscopy.   PAST MEDICAL HISTORY:  1. Coronary artery disease. 2. Myocardial infarction.  3. Hyperlipidemia.  4. Chronic back pain. 5. Fibromyalgia. 6. Depression. 7. Hypertension. 8. Nephrolithiasis. 9. Hiatal hernia. 10. Noncardiac chest pain. Cardiac catheterization revealed 100% RCA occlusion at that point but otherwise cardiac catheterization was unremarkable.  11. History of abdominal aortic aneurysm.   PAST SURGICAL HISTORY:  1. Hysterectomy.  2. Ankle surgery. 3. Back surgery with nerve stimulator. 4. Cardiac catheterization.   ALLERGIES: Multiple medications including amoxicillin, Flexeril, Levaquin, Neurontin, Ranexa, sulfa, Toradol, latex and tape.   MEDICATIONS AT HOME:   1. Promethazine. 2. Pepcid. 3. Omeprazole. 4. Enalapril. 5. Metoprolol. 6. Crestor. 7. Carbidopa. 8. Aspirin. 9. Albuterol.   FAMILY HISTORY: Positive for lupus.   REVIEW OF SYSTEMS: Negative except for what is mentioned in the history of present illness.   SOCIAL HISTORY: She used to be a smoker, quit a few years ago.   PHYSICAL EXAMINATION:  GENERAL: Well-built female. She does not appear to be in any acute distress, appears comfortable. Fully awake, alert, and oriented.   VITAL SIGNS: Stable. Temperature 97.9, pulse 78, respirations 18, blood pressure 121/84.   HEENT: Unremarkable.   NECK: Veins are flat.   LUNGS: Clear to auscultation bilaterally with fair air entry and no added sounds.   CARDIOVASCULAR: Regular rate and rhythm. No gallops or murmur.   ABDOMEN: Mild to moderate epigastric tenderness. There is no rebound or guarding. No hepatosplenomegaly was noted. Abdominal examination is otherwise unremarkable.   NEUROLOGIC: Examination appears to be unremarkable.   LABORATORY, DIAGNOSTIC, AND RADIOLOGICAL DATA: White cell count 14,000, hemoglobin 14.4, hematocrit 45.9, platelet count 348. Serum lipase is normal at 156. Troponins are negative. Liver enzymes are normal as well as BUN and creatinine and electrolytes. Stool is negative for C. difficile toxin. Urinalysis is unremarkable. CT scan of abdomen and pelvis was done on admission which showed no acute finding. An ultrasound as well as a nuclear medicine HIDA scan of gallbladder are unremarkable.   ASSESSMENT AND PLAN: Patient with epigastric pain. She has history of chronic pain as well as history of intermittent diarrhea highly suggestive of irritable bowel syndrome in view of the fact that she has other disorders commonly seen with irritable bowel syndrome such as fibromyalgia, etc. Patient's epigastric pain appears to be more intense than usual and other lesions such  as peptic ulcer disease is a consideration. I  would recommend an upper GI endoscopy for further evaluation and to rule out peptic ulcer disease. Most likely patient will benefit from symptomatic treatment of suspected irritable bowel syndrome with medication such as Bentyl etc. Will make further recommendations after the upper GI endoscopy. The procedure of upper GI endoscopy has been discussed with the patient. She is in full agreement. Will proceed with an upper GI endoscopy tomorrow. Further recommendations to follow.   ____________________________ Lurline Del, MD si:cms D: 06/11/2012 22:46:26 ET T: 06/12/2012 10:17:24 ET JOB#: [id number]  cc: Lurline Del, MD, <Dictator> Letitia Caul, MD  Lurline Del MD ELECTRONICALLY SIGNED 06/14/2012 13:23

## 2015-03-02 NOTE — Consult Note (Signed)
Chief Complaint:   Subjective/Chief Complaint EGD unremarkable except for mild gastritis and a small hiatal hernia.  Impression: Chronic abdominal pain and intermittent diarrhea most likely secondary to IBS.  Recommendations: Bentyl 10 mg prn. Resume diet. Follow up with me as OP. Orders written. Will sign off. Thanks.   Electronic Signatures: Lurline Del (MD)  (Signed 31-Jul-13 12:09)  Authored: Chief Complaint   Last Updated: 31-Jul-13 12:09 by Lurline Del (MD)

## 2015-03-02 NOTE — H&P (Signed)
PATIENT NAME:  Kristin Coleman, Kristin Coleman MR#:  [id number] DATE OF BIRTH:  08-25-1966  DATE OF ADMISSION:  06/11/2012  ADDENDUM  ASSESSMENT AND PLAN: A 49 year old female presenting with acute on chronic abdominal pain associated with nausea and diarrhea with intermittent chest pain of unclear etiology.  1. Abdominal pain, unclear etiology. At this time initial work-up is negative for acute cholecystitis, pancreatitis. Things on the differential could still possibly be cholecystitis. We will check a HIDA scan to rule out any subacute issues given the fact that she does have leukocytosis. Another possibility would be parahepatitis from gonorrhea. Although the patient denies active symptoms she could be a carrier and not know it. We are going to check gonorrhea culture at this time. Given the chronicity of her symptoms and with these intermittent exacerbations, I am also concerned for irritable bowel syndrome. At this time, e will consult gastroenterology for any further recommendations. We will place her n.p.o. for now with p.r.n. pain medications. She does not have frank fevers nor does she have any jaundice, so I am less concerned for ascending cholangitis at this point in time. I will check blood cultures, but will hold antibiotics. If she further degenerates, we can consider antibiotics at that time. We will also add p.r.n. antiemetics as well as p.r.n. pain control. 2. Hypokalemia, likely due to diarrhea. This will be repleted.  3. Intermittent chest pain. Most likely not to be cardiac at this point as cardiac enzymes have been negative for two sets. Likely is referred pain from an abdominal etiology. Her abdominal aneurysm is deemed stable on ultrasound.  4. Coronary artery disease. The patient does report increased dyspnea on exertion. She likely could benefit from outpatient stress test given the atypical nature of her chest pain.  5. History of coronary artery disease. We will continue her on her home  medications.  6. History of hypertension. The patient is currently stable. We will continue her home medications at this time.  7. History of hyperlipidemia. We will continue her Crestor. 8. Gastroesophageal reflux disease. We will continue her omeprazole.        CODE STATUS: FULL CODE.   TIME SPENT:  60 minutes. ____________________________ Aurther Loft, DO aeo:slb D: 06/11/2012 06:58:28 ET T: 06/11/2012 08:44:21 ET JOB#: [id number]  cc: Aurther Loft, DO, <Dictator>

## 2015-03-02 NOTE — H&P (Signed)
Coleman NAME:  Kristin Coleman, Kristin Coleman MR#:  [id number] DATE OF BIRTH:  Dec 07, 1965  DATE OF ADMISSION:  07/09/2012  PRIMARY CARE PHYSICIAN: Rolm Gala, MD   CARDIOLOGIST: Dr. Okey Dupre at [facility]   CHIEF COMPLAINT: Chest pain today.   HISTORY OF PRESENT ILLNESS: Kristin Coleman is a 49 year old Caucasian female with multiple chronic medical conditions notable for hypertension, coronary artery disease, status post stent in Kristin past, chronic abdominal pain, chronic diarrhea, history of chronic low back pain, status post stimulator placement, chronic abdominal pain, depression, fibromyalgia, comes to [facility] with complaints of chest pain that woke her up from sleep today. Kristin Coleman describes her pain more so in Kristin left jaw with Kristin left arm. She denies any exertional work at home. She took two nitroglycerin at home which did not help much, and morphine in [facility] has eased some of her pain. Given her history of coronary artery disease and other risk factors, she is being admitted for further evaluation and management.   PAST MEDICAL HISTORY:  1. Myocardial infarction with history of coronary artery disease and stent placement at [facility] in Kristin past. Her last heart catheterization was done by Dr. Juliann Pares on 03/20/2012 which showed normal wall motion, normal LVEF with ejection fraction of 55%, coronary artery left main is normal, LAD normal, circumflex normal, RCA 100% proximal, mild collaterals left to right. Medical therapy was recommended at that time.   2. Gastroesophageal reflux disease.  3. Fibromyalgia.  4. Depression.  5. Hypertension.  6. Nephrolithiasis.  7. Hiatal hernia.  8. History of abdominal aortic aneurysm, measures around 2.7 cm.   PAST SURGICAL HISTORY:  1. Partial hysterectomy with tubal ligation.  2. Ankle surgery.  3. Back surgery with nerve stimulator placement.    ALLERGIES: Amoxicillin, Flexeril, Levaquin, Neurontin, Ranexa, sulfa,  Toradol and Latex tape.   SOCIAL HISTORY: She lives at home. Nonsmoker. Nonalcoholic.   FAMILY HISTORY: Positive for lupus and Raynaud's in sister. Coronary artery disease in father and other siblings. Her mother had breast cancer.    REVIEW OF SYSTEMS: CONSTITUTIONAL: No fever, fatigue, weakness. EYES: No blurred or double vision or glaucoma. ENT: No tinnitus, ear pain, hearing loss or epistaxis. RESPIRATORY: No cough, wheeze, hemoptysis or chronic obstructive pulmonary disease. CARDIOVASCULAR: Positive for chest pain, no orthopnea, edema. Positive for hypertension. GASTROINTESTINAL: No nausea, vomiting, diarrhea or abdominal pain at this time. GENITOURINARY: No dysuria or hematuria. ENDOCRINE: No polyuria, nocturia, or thyroid problems. HEMATOLOGY: No anemia or easy bruising. SKIN: No acne or rash. MUSCULOSKELETAL: Positive for arthritis and back pain. NEUROLOGIC: No cerebrovascular accident or transient ischemic attack. PSYCHIATRIC: No anxiety or depression. All other systems reviewed and negative.   PHYSICAL EXAMINATION:  GENERAL: Kristin Coleman is awake, alert, oriented x3, not in acute distress.   VITAL SIGNS: She is afebrile, pulse is 100, blood pressure is 147/91, saturations are 97 percent on room air.   HEENT: Atraumatic, normocephalic. Pupils are equal, round, and reactive to light and accommodation. Extraocular movements intact. Oral mucosa is moist.   NECK: Supple. No JVD. No carotid bruit.   LUNGS: Clear to auscultation bilaterally. No rales, rhonchi, respiratory distress, or labored breathing.   HEART: Both Kristin heart sounds are normal. Rate, rhythm is regular. PMI is not lateralized. Chest is nontender.   EXTREMITIES: Good pedal pulses, good femoral pulses. No lower extremity edema.  ABDOMEN: Soft, benign, and nontender. No organomegaly. Positive bowel sounds.   NEUROLOGIC: Grossly intact cranial nerves II through XII.  No motor or sensory deficits.   PSYCHIATRIC: Kristin Coleman is  awake, alert, oriented x3.   SKIN: Warm and dry.   LABORATORY, DIAGNOSTIC AND RADIOLOGICAL DATA: EKG shows sinus rhythm with PVCs. Chest x-ray: No acute cardiopulmonary abnormality. White count is 12.0, hemoglobin and hematocrit is 13.6 and 41.9, platelet count is 240. Glucose is 90, BUN 6, creatinine 0.62, sodium 139, potassium 3.8, chloride 108. Troponin is 0.02.   ASSESSMENT: Kristin Coleman is a 49 year old with:  1. Chest pain/unstable angina in Kristin setting of history of coronary artery disease, status post stent in Kristin past: Kristin Coleman will be admitted to a telemetry floor. We will give a prudent diet. Cycle cardiac enzymes x3. Continue home medications, which is aspirin, enalapril and metoprolol. Nitroglycerin sublingual p.r.n. for chest pain. We will cycle cardiac enzymes x3. We will schedule Kristin Coleman for a Myoview stress test unless she rules in. We will have Cardiology consultation.  2. Acid reflux/gastroesophageal reflux disease: Continue Omeprazole.  3. Hyperlipidemia: On Crestor.  4. Hypertension: Kristin Coleman is on enalapril and metoprolol.  5. Restless leg syndrome: Continue carbidopa/levodopa p.r.n.   6. Deep vein thrombosis prophylaxis: We will give heparin subcutaneous b.i.d.  7. Further work-up according to Kristin Coleman's clinical course.   Kristin hospital admission plan was discussed with Kristin Coleman. No family member was present. Kristin case was discussed with Dr. Juliann Pares, who is agreeable for Myoview stress test at this time.  TIME SPENT:  50 minutes.   ____________________________ Wylie Hail Allena Katz, MD sap:cbb D: 07/09/2012 15:26:24 ET T: 07/09/2012 16:38:36 ET JOB#: [id number] cc: Ismeal Heider A. Allena Katz, MD, <Dictator> Letitia Caul, MD Willow Ora MD ELECTRONICALLY SIGNED 07/16/2012 15:44

## 2015-03-02 NOTE — Discharge Summary (Signed)
PATIENT NAME:  Kristin Coleman, Kristin Coleman MR#:  [id number] DATE OF BIRTH:  [birth year]  DATE OF ADMISSION:  07/09/2012 DATE OF DISCHARGE:  07/10/2012  DISCHARGE DIAGNOSES:  1. Chest pain secondary to chronic unstable angina. 2. Hypertension. 3. Coronary artery disease.  4. Hyperlipidemia.  5. Restless leg syndrome.  6. Fibromyalgia.  7. Chronic pain syndrome.   ALLERGIES: She has multiple allergies to Levaquin, sulfa, Neurontin, Ranexa, Flexeril, Toradol, amoxicillin, tape and latex.   DISCHARGE MEDICATIONS:  1. Aspirin 81 mg daily.  2. Enalapril 10 mg p.o. b.i.d.  3. Metoprolol 50 mg p.o. once daily.  4. Carbidopa/levodopa 50/200 milligrams 1 tablet p.o. b.i.d.  5. Omeprazole 20 mg p.o. b.i.d.  6. Crestor 40 mg p.o. daily.  7. Famotidine 40 milligrams p.o. b.i.d.   DIET: Low sodium, low fat diet.  FOLLOW-UP: Dr. Juliann Pares.   CONSULTATION: Dr. Juliann Pares.   HOSPITAL COURSE: 49 year old female with multiple medical problems of hypertension, coronary artery disease, fibromyalgia, depression, hiatal hernia, obesity, smoking who came in because of chest pain. The patient had multiple cardiac stents and multiple cardiac catheterizations. Recent catheterization was a few months ago, showed no change and the patient had chronically occluded right coronary artery. The patient had evaluation at [facility] who suggested continued medical management. The patient was unable to tolerate Imdur, nitroglycerin and Ranexa. She came in because of the chest pain. The patient was admitted overnight for chest pain rule out and troponins have been negative. The patient's recent catheterization was May 2013. The patient continued on her aspirin and also beta blockers. WBC showed slight elevation of around 12 and electrolytes remained stable. Troponins have been negative x3 and she had a stress test which showed ejection fraction around 45 to 50% and adequate Lexiscan with no evidence of stress-induced myocardial ischemia.  There is evidence of possible coronary disease in the inferior segment which is persistent and nonischemic and ejection fraction is mildly reduced at 45 to 50% and Dr. Juliann Pares recommended medical treatment and recommendation for further evaluation. The patient was advised that she did not have any new blockages and stress test was like the way it was before and Dr. Juliann Pares recommended to discharge her home. Discharge blood pressure 106/69, pulse 56, respirations 18, temperature within normal limits. Sats 97% on room air. The patient is advised to continue her aspirin, beta blockers and ACE inhibitors and Crestor for her coronary artery disease and follow up with Dr. Juliann Pares and also Dr. Rolm Gala, her primary doctor, as scheduled.   TIME SPENT ON DISCHARGE PREPARATION: More than 30 minutes.  ____________________________ Katha Hamming, MD sk:ap D: 07/18/2012 16:14:37 ET T: 07/19/2012 13:49:56 ET JOB#: [id number]  cc: Katha Hamming, MD, <Dictator> Letitia Caul, MD Katha Hamming MD ELECTRONICALLY SIGNED 07/25/2012 18:19

## 2015-03-02 NOTE — Consult Note (Signed)
PATIENT NAME:  Kristin Coleman, Kristin Coleman MR#:  [id number] DATE OF BIRTH:  06-08-1966  DATE OF CONSULTATION:  07/09/2012  REFERRING PHYSICIAN:  Enedina Finner, MD   CONSULTING PHYSICIAN:  Saniyyah Elster D. Juliann Pares, MD PRIMARY CARE PHYSICIAN: Rolm Gala, MD   PRIMARY CARDIOLOGIST: Dr. Okey Dupre at [facility]   INDICATION: Chest pain and angina.   HISTORY OF PRESENT ILLNESS: Kristin Coleman is a 49 year old white female with history of multiple medical problems including hypertension, coronary artery disease, fibromyalgia, depression, nephrolithiasis, hiatal hernia, mild obesity, past history of smoking. She has had multiple cardiac stents and multiple cardiac catheterizations, most recent catheterization was a few months ago which showed no change and chronically occluded right coronary artery. She had evaluation at [facility] which suggested continue medical therapy. Unfortunately, she is unable to tolerate most of the medications that would be of any benefit including nitroglycerin, Imdur and Ranexa. The patient presented with recurrent chest pain, came to Emergency Room and was advised to be admitted for further evaluation and care.   REVIEW OF SYSTEMS: No blackout spells or syncope. No nausea or vomiting. No fever, no chills, no sweats. No weight loss. No weight gain. No hemoptysis or hematemesis. Denies bright red blood per rectum.   PAST MEDICAL HISTORY:  1. Myocardial infarction, chronically occluded right coronary artery, most recent catheterization 03/20/2012.  2. Reflux. 3. Fibromyalgia. 4. Depression. 5. Hypertension.  6. Nephrolithiasis.  7. Hiatal hernia. 8. Small abdominal aortic aneurysm.  9. Mild obesity.  10. Past history of smoking.    PAST SURGICAL HISTORY:  1. Partial hysterectomy with tubal ligation.  2. Ankle surgery.  3. Back surgery.   ALLERGIES: Amoxicillin, Flexeril, Levaquin, Neurontin, Ranexa, sulfa, Toradol, Latex tape, Imdur.   SOCIAL HISTORY: She lives with her  husband. She denies recent smoking or alcohol consumption.   FAMILY HISTORY: Lupus, Raynaud's, coronary artery disease, breast cancer.   PHYSICAL EXAMINATION:  VITAL SIGNS: Blood pressure 150/90, pulse 100, respiratory rate 16, afebrile.   HEENT: Normocephalic, atraumatic. Pupils are equal and reactive to light.   NECK: Supple. No jugular venous distention, bruits, or adenopathy.   LUNGS: Clear to auscultation and percussion. No significant wheeze, rhonchi, or rale.   HEART: Regular rate and rhythm, slightly tachycardic, slight systolic ejection murmur at left sternal border.   ABDOMEN: Exam is benign.   EXTREMITIES: Exam is within normal limits.   NEUROLOGIC: Exam is intact.   SKIN: Exam is normal.   LABORATORY, DIAGNOSTIC AND RADIOLOGICAL DATA: EKG: Normal sinus rhythm, rate of about 100, nonspecific ST-T wave changes. White count 12, hemoglobin 13.6, hematocrit 41.9, platelet count 240, glucose 90, BUN 6, creatinine 0.62, sodium 129, potassium 3.8, chloride 108, troponin 0.2.    ASSESSMENT:  1. Chest pain, unstable angina. 2. Coronary artery disease. 3. Reflux. 4. Hyperlipidemia. 5. Hypertension. 6. Restless leg syndrome. 7. Mild obesity. 8. Fibromyalgia. 9. Chronic pain syndrome.   PLAN:  1. I agree with admit, rule out for myocardial infarction. I would take a very conservative route. I would not proceed with cardiac catheterization,  probably proceed with a functional study. It looks like it was done previously before.  We will just continue medical therapy. Continue blood pressure control. Continue statin therapy.  2. The patient will probably need chronic pain syndrome evaluation and treatment with the Pain Clinic.  3. Continue reflux therapy.  4. Have the patient follow up with Dr. Okey Dupre at [facility] provided she is discharged with medical therapy.  ____________________________ Bobbie Stack. Juliann Pares, MD ddc:cbb  D: 07/10/2012 06:39:34 ET T: 07/10/2012 11:11:34  ET JOB#: [id number]  cc: Jacqueli Pangallo D. Juliann Pares, MD, <Dictator> Alwyn Pea MD ELECTRONICALLY SIGNED 08/12/2012 12:19

## 2015-03-02 NOTE — Consult Note (Signed)
Brief Consult Note: Diagnosis: Abdominal paina nd diarrhea.   Patient was seen by consultant.   Comments: Probable IBS.  Recommendations: EGD in am to r/o PUD. Further recommendations to follow.  Electronic Signatures: Lurline Del (MD)  (Signed 30-Jul-13 18:06)  Authored: Brief Consult Note   Last Updated: 30-Jul-13 18:06 by Lurline Del (MD)

## 2015-03-05 NOTE — Discharge Summary (Signed)
PATIENT NAME:  Kristin Coleman, Kristin Coleman MR#:  [id number] DATE OF BIRTH:  [birth year]  DATE OF ADMISSION:  04/09/2013 DATE OF DISCHARGE:  04/11/2013  The patient left AGAINST MEDICAL ADVICE in the morning of 04/11/2013.   For a detailed note, please take a look at the history and physical done on admission by Dr. Rudene Re.   CONSULTANTS DURING THE HOSPITAL COURSE:  Dr. Juliann Pulse from general surgery and Dr. Barnetta Chapel from gastroenterology.   PERTINENT STUDIES DURING THE HOSPITAL COURSE:  A KUB done on admission showing nonobstructive bowel gas pattern.  An ultrasound of the abdomen done showing no abdominal aortic aneurysm, poor visualizing of the pancreas, granulomatous calcifications in the spleen.  No cholelithiasis or evidence of acute cholecystitis.   HOSPITAL COURSE:  This is a 49 year old female with past medical history of coronary artery disease status post stent placement, hypertension, hyperlipidemia, history of abdominal aortic aneurysm, GERD, hiatal hernia, fibromyalgia, depression, who presented to the hospital with epigastric pain.  1.  Epigastric pain.  The exact etiology of her epigastric pain is still unclear.  She presented with epigastric pain, nausea and diarrhea, although diarrhea was never observed.  Her LFTs were normal.  Her abdominal ultrasound on admission was essentially benign.  The patient was seen by general surgery for a possible diagnosis of acute cholecystitis.  They recommended a HIDA scan.  We attempted to do a HIDA scan on the patient, although patient could not tolerate it due to claustrophobia.  Despite giving her anxiolytics like Valium and Ativan prior to the HIDA scan she still could not tolerate it.  GI consult was obtained.  The patient was seen by Dr. Marva Panda who also wanted to do an endoscopy and the patient initially refused that.  The patient was already on a proton pump inhibitor and Carafate at home prior to coming in.  2.  Restless leg syndrome.  The  patient was maintained on her Requip.  She will continue that.   3.  Hypertension.  She remained hemodynamically stable on her home antihypertensives including Norvasc, metoprolol and lisinopril.  4.  History of coronary artery disease.  She had no acute chest pain.  She was maintained on her aspirin, Plavix, statin, beta-blocker.  5.  Gastroesophageal reflux disease.  The patient was maintained on her Carafate and Protonix.    Despite our multiple attempts to work her up for epigastric abdominal pain, the patient could not tolerate any further testing.  When attempting to explain to her about the fact that if she truly has pain she will need to get this test done.  She got a bit upset and decided to leave AGAINST MEDICAL ADVICE on the morning of 04/11/2013.    Time spent is 40 minutes.     ____________________________ Rolly Pancake. Cherlynn Kaiser, MD vjs:ea D: 04/11/2013 16:19:02 ET T: 04/12/2013 05:02:25 ET JOB#: [id number]  cc: Rolly Pancake. Cherlynn Kaiser, MD, <Dictator> Houston Siren MD ELECTRONICALLY SIGNED 04/20/2013 20:33

## 2015-03-05 NOTE — H&P (Signed)
PATIENT NAME:  Kristin Coleman, Kristin Coleman MR#:  [id number] DATE OF BIRTH:  [birth year]  DATE OF ADMISSION:  05/05/2013  PRIMARY CARE PHYSICIAN:  Dr. Gavin Potters  REFERRING PHYSICIAN:  Dr. Brien Mates   CHIEF COMPLAINT: Chest pain this morning.   HISTORY OF PRESENT ILLNESS: A 49 year old Caucasian female with a history of CAD, 2-vessel disease, status post a stent, hypertension, hyperlipidemia, who presented to the ED with chest pain since this morning. The patient is alert, awake, oriented, in no acute distress. The patient said she started to have chest pain at about 4:00 a.m. after she woke up. The chest pain was sharp, constant, radiation to the left arm. The patient  was sent to ED and chest pain was relieved by nitroglycerin. In addition, the patient complains of  nausea, mild sweating, but denies any fever or chills. No cough, sputum or shortness of breath. No orthopnea or nocturnal dyspnea or leg edema. The patient's troponin level was negative. The patient was suspected unstable angina, was started on heparin drip. The patient just got a cardiac cath this March.  ED PA called Dr. Kirke Corin for suggestion, and Dr. Kirke Corin suggested admitting the patient.   PAST MEDICAL HISTORY: CAD, hypertension, hyperlipidemia,  AAA, GERD, hiatal hernia, fibromyalgia, depression, kidney stone.   PAST SURGICAL HISTORY: Partial hysterectomy, back surgery, tubal ligation, ankle surgery and a cardiac cath and stent.   SOCIAL HISTORY: No smoking, alcohol drinking, or illicit drugs.   FAMILY HISTORY: Lupus in her sister, CAD in some siblings. Father and her grandfather died of MI. Positive for breast cancer in her mother.   REVIEW OF SYSTEMS:  CONSTITUTIONAL: The patient denies any fever or chills. No headache or dizziness. No weakness.  EYES: No double vision, blurry vision.  ENT: No postnasal drip, slurred speech or dysphagia. No epistaxis.  CARDIOVASCULAR: Positive for chest pain and mild sweating. No orthopnea, nocturnal dyspnea  or leg edema.  PULMONARY: No cough, sputum, shortness of breath or hemoptysis.  GASTROINTESTINAL: Positive for nausea, but no vomiting, diarrhea, melena or bloody stool.  GENITOURINARY: No dysuria, hematuria, or incontinence.  SKIN: No rash or jaundice.  ENDOCRINE: No polyuria, polydipsia, heat or cold intolerance.  HEMATOLOGY: No easy bruising, bleeding.  NEUROLOGY: No syncope, loss of consciousness or seizure.  PSYCHIATRIC: No anxiety or depression.   ALLERGIES: AMOXICILLIN, FLEXERIL, LEVAQUIN, NEURONTIN, RANEXA,  SULFA DRUGS, TORADOL, LATEX, TAPE.   HOME MEDICATIONS: Acetaminophen/oxycodone 325 mg/7.5 mg p.o. 1 tablet 1 to 4 times a day, aspirin 81 mg p.o. daily, Carafate 1 gram p.o. 4 times a day before meals and at bedtime, enalapril 10 mg p.o. b.i.d.,  Lipitor 80 mg p.o. at bedtime, Lopressor 50 mg p.o. once a day at bedtime, extended-release, nitroglycerin 0.4 mg sublingual tablet sublingual every 5 minutes p.r.n. for chest pain, Norvasc 5 mg p.o. daily, Plavix 75 mg p.o. daily, Protonix 20 mg p.o. daily, Proventil HFA CFC-free 90 mcg inhaler 2 puffs inhaled every 4 hours p.r.n. for shortness of breath. Requip 5 mg p.o. once a day at bedtime.   PHYSICAL EXAMINATION: VITAL SIGNS: Temperature 98. Blood pressure was 190/100, now decreased to 138/77. Pulse 63, O2 saturation 98% on room air.  GENERAL: The patient is alert, awake, oriented, in no acute distress.  HEENT: Pupils round, equal and reactive to light and accommodation. Moist oral mucosa. Clear oropharynx.  NECK: Supple. No JVD or carotid bruits. No lymphadenopathy. No thyromegaly.  CARDIOVASCULAR: S1, S2 regular rate and rhythm. No murmurs, gallops.  PULMONARY: Bilateral air entry. No wheezing.  No rales. No use of accessory muscles to breathe.  ABDOMEN: Soft. No distention. No tenderness. No organomegaly. Bowel sounds present.  EXTREMITIES: No edema, clubbing or cyanosis. No calf tenderness. Strong bilateral pedal pulses.  SKIN:  No rash or jaundice.   NEUROLOGIC: A and O x3. No focal deficit.   LABORATORY, DIAGNOSTIC, AND RADIOLOGIC DATA:  Chest x-ray: No acute cardiopulmonary disease. CBC is normal. Glucose 93, BUN 6, creatinine 0.72. Electrolytes normal. Troponin less than 0.02. CK 86, CK-MB less than 0.5, INR 0.9.   EKG showed normal sinus rhythm. IMPRESSION: 1.  Chest pain, possible unstable angina.  2.  Need to rule out acute coronary syndrome.  3.  Coronary artery disease.  4.  Accelerated hypertension.  5.  Hyperlipidemia.  6.  Abdominal aortic aneurysm.   7.  Gastroesophageal reflux disease.    PLAN OF TREATMENT: The patient will be placed for observation. We will continue O2 by nasal cannula. Continue heparin drip. Continue the patient's home medication, aspirin, Plavix, Lipitor, Lopressor and enalapril, Norvasc. We will give hydralazine IV p.r.n. Follow up a troponin level. We will get a cardiology consult from Dr. Kirke Corin for further recommendation. I discussed the patient's condition and plan of treatment with the patient.   TIME SPENT: About 52 minutes.    ____________________________ Shaune Pollack, MD qc:cc D: 05/05/2013 14:08:53 ET T: 05/05/2013 14:44:10 ET JOB#: [id number]  cc: Shaune Pollack, MD, <Dictator> Shaune Pollack MD ELECTRONICALLY SIGNED 05/06/2013 15:01

## 2015-03-05 NOTE — Consult Note (Signed)
PATIENT NAME:  Kristin Coleman, Kristin Coleman MR#:  [id number] DATE OF BIRTH:  Feb 16, 1966  DATE OF CONSULTATION:  11/23/2012  REFERRING PHYSICIAN:  Herschell Dimes. Renae Gloss, MD CONSULTING PHYSICIAN:  Marcina Millard, MD  PRIMARY CARE PHYSICIAN: Dr. Gavin Potters.  CARDIOLOGISTS: Dr. Juliann Pares and Dr. Okey Dupre.   REASON FOR CONSULTATION: Consultation requested for evaluation of chest pain.   HISTORY OF PRESENT ILLNESS: The patient is a 49 year old female with known coronary artery disease referred for evaluation of chest pain. The patient has a history of inferior MI, multiple coronary stents to right coronary artery with now a chronically occluded right coronary artery by recent cardiac catheterization with collaterals. The patient presents with prolonged episode of chest pain occurring at rest, unrelieved with nitroglycerin. She describes it as a fullness sensation under her left breast. She presented to [facility] Emergency Room where EKG was nondiagnostic. The patient has ruled out for myocardial infarction by CPK, isoenzymes and troponin.   PAST MEDICAL HISTORY:  1.  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 2.  Chronically occluded right coronary artery by cardiac catheterization in May 2013 with collaterals.  3.  Hypertension.  4.  Gastroesophageal reflux.  5.  Fibromyalgia. 6.  Depression.  7.  History of abdominal aortic aneurysm.  8.  Hyperlipidemia.   HOME MEDICATIONS: Aspirin 81 mg daily, Crestor 40 mg daily, enalapril 10 mg b.i.d., acetaminophen/oxycodone 325/7.5 mg 1 tablet t.i.d. p.r.n., carbidopa/levodopa 50/200 b.i.d., famotidine 40 mg b.i.d.   SOCIAL HISTORY: The patient is married, lives with her husband. She is on disability due to chronic back pain and chest pain. She has a 25 pack-year tobacco abuse history, quit in 2008.   FAMILY HISTORY: Father died status post MI.  REVIEW OF SYSTEMS:   CONSTITUTIONAL: No fever or chills.  EYES: No blurry vision.  EARS: No  hearing loss.  RESPIRATORY: No shortness of breath.  CARDIOVASCULAR: Chest pain as described above.  GASTROINTESTINAL: No nausea, vomiting, diarrhea or constipation.  GENITOURINARY: No dysuria or hematuria.  ENDOCRINE: No polyuria or polydipsia.  INTEGUMENTARY: No rash.  MUSCULOSKELETAL: No arthralgias or myalgias.  NEUROLOGICAL: No focal muscle weakness or numbness.  PSYCHOLOGICAL: No depression or anxiety.   PHYSICAL EXAMINATION:  VITAL SIGNS: Blood pressure 107/73, pulse 69, respirations 17, temperature 97.5, pulse oximetry 96%.  HEENT: Pupils equal, reactive to light and accommodation.  NECK: Supple without thyromegaly.  LUNGS: Clear.  HEART: Normal JVP. Normal PMI. Regular rate and rhythm. Normal S1, S2. No appreciable gallop, murmur or rub.  ABDOMEN: Soft and nontender.  EXTREMITIES: Pulses were intact bilaterally.  MUSCULOSKELETAL: Normal muscle tone.  NEUROLOGICAL: The patient is alert and oriented x 3. Motor and sensory both grossly intact.   IMPRESSION: This is a 49 year old female with known coronary artery disease, chronically occluded right coronary artery with collaterals, apparently not felt to be amenable to percutaneous coronary intervention of chronic total occlusion since the patient has had a history of multiple stents in the right coronary artery. Chest pain has atypical features, negative troponin, no ECG changes. The patient does have a history of significant reflux disease. In addition, THE PATIENT IS INTOLERANT OF NITRATES AND IS ALLERGIC TO RANEXA.   RECOMMENDATIONS:  1.  Agree with overall current therapy.  2.  Consider adding low-dose calcium channel blocker for antianginal effect and for hypertension.  3.  Consider further GI workup for possible worsening reflux disease.  4.  Increase activity and ambulation. If the patient does well, may consider discharge and continue further  cardiac evaluation as outpatient.  5.  Would defer repeat cardiac catheterization  at this time.    ____________________________ Marcina Millard, MD ap:jm D: 11/23/2012 10:35:50 ET T: 11/23/2012 16:34:20 ET JOB#: [id number]  cc: Marcina Millard, MD, <Dictator> Marcina Millard MD ELECTRONICALLY SIGNED 11/29/2012 8:47

## 2015-03-05 NOTE — H&P (Signed)
PATIENT NAME:  Kristin Coleman, Kristin Coleman MR#:  [id number] DATE OF BIRTH:  25-Jun-1966  DATE OF ADMISSION:  04/09/2013  PRIMARY CARE PHYSICIAN: Dr. Rolm Gala.   REFERRING PHYSICIAN: Governor Rooks.   CHIEF COMPLAINT: Severe epigastric pain.   HISTORY OF PRESENT ILLNESS: Mrs. Lohn is a 49 year old Caucasian female with a history of coronary artery disease and systemic hypertension and history of gastroesophageal reflux disease. She was in her usual state of health until about 3 weeks ago when she started to have epigastric pain not responding to Protonix and Carafate; however, her epigastric pain worsened over the last 24 hours. The severity of the pain is 10 on a scale of 10. The pain is described as a nagging pain, nonradiating, associated with mild nausea but no vomiting. No melena or hematochezia, but she reports a little diarrhea just today. She also tried Percocet at home and that did not help the pain. Therefore, she came to the hospital for further evaluation. She had ultrasound of the abdomen which showed some sludge in the gallbladder. Otherwise, it was a minimal amount without evidence of cholelithiasis or cholecystitis. Surgical consult with Dr. Juliann Pulse was obtained, and he felt it is not a surgical case but advised HIDA scan. The patient tells me that her pain is exacerbated by taking any meal, and the pain worsens immediately after eating. Her pain is now down to about 6 or 7 on a scale of 10 after receiving pain medication. She looks resting comfortably.   REVIEW OF SYSTEMS:  CONSTITUTIONAL: Denies having any fever. No chills, although she tells me that earlier yesterday she had low-grade fever but none since. No fatigue.  EYES: No blurring of vision. No double vision.  ENT: No hearing impairment. No sore throat. No dysphagia.  CARDIOVASCULAR: No chest pain. No shortness of breath. No syncope. No palpitations.  RESPIRATORY: No chest pain. No shortness of breath. No cough.   GASTROINTESTINAL: Abdominal pain at the epigastric area as described above. No vomiting. She has mild diarrhea today. No hematochezia. No melena.  GENITOURINARY: No dysuria. No frequency of urination.  MUSCULOSKELETAL: No joint pain or swelling. No muscular pain or swelling other than her fibromyalgia.  INTEGUMENTARY: No skin rash. No ulcers.  NEUROLOGY: No focal weakness. No seizure activity. No headache.  PSYCHIATRY: No anxiety or depression.  ENDOCRINE: No polyuria or polydipsia. No heat or cold intolerance.   PAST MEDICAL HISTORY: Coronary artery disease, 2 vessel disease status post stent implant to left circumflex and multiple stents in the right coronary artery. Systemic hypertension, hyperlipidemia, abdominal aortic aneurysm, gastroesophageal reflux disease, hiatal hernia fibromyalgia, depression, kidney stones.   PAST SURGICAL HISTORY: Partial hysterectomy, back surgery, tubal ligation, ankle surgery and cardiac catheter.   FAMILY HISTORY: Her sister has lupus. Sibling has coronary artery disease.   SOCIAL HISTORY: She is married, living with her husband. She lives on disability.   SOCIAL HABITS: Nonsmoker currently. She quit smoking in 2008. She has a past history of 25 years of smoking. No history of alcohol or other drug abuse.   ADMISSION MEDICATIONS: Protonix 20 mg a day, Carafate 1 g 4 times a day, Requip 5 mg at bedtime, Proventil HFA 2 puffs q.4 hours p.r.n., Plavix 75 mg a day, aspirin 81 mg a day, Norvasc 5 mg a day, nitroglycerin sublingual p.r.n., metoprolol succinate 50 mg once a day, Lipitor 80 mg once a day, enalapril 10 mg twice a day and Percocet 7.5/325 four times a day p.r.n.   ALLERGIES: SULFA, RANEXA.  ALSO SIDE EFFECTS FROM FLEXERIL AND TORADOL CAUSING MUSCLE ACHES. AMOXICILLIN CAUSING NAUSEA, VOMITING AND DIARRHEA. THIS IS JUST A SIDE EFFECT RATHER THAN ALLERGY. NEURONTIN CAUSING RASH. LEVAQUIN AND LATEX. LATEX CAUSING ITCHING AND RASH.   PHYSICAL EXAMINATION:   VITAL SIGNS: Blood pressure 136/80, respiratory rate 18, pulse 72, temperature 98.6, oxygen saturation 99%.  GENERAL APPEARANCE: This is a young female lying in bed in no acute distress. At the time of my examination, she is lying down flat on her back, comfortable.  HEAD AND NECK: No pallor. No icterus. No cyanosis. Ear examination revealed normal hearing, no discharge, no lesions, no ulcers. Examination of the nose showed normal findings without bleeding and no discharge, no ulcerations. Oropharyngeal examination was normal without any ulcers, no oral thrush. Eye examination revealed normal eyelids and conjunctivae. Pupils about 5 to 6 mm, equal and reactive to light. Neck is supple. Trachea at midline. No thyromegaly. No cervical lymphadenopathy. No masses.  HEART: Normal S1, S2. No S3, S4. No murmur. No gallop. No carotid bruits.  RESPIRATORY: Normal breathing pattern without use of accessory muscles. No rales. No wheezing.  ABDOMEN: Soft. There is tenderness at the epigastric area upon deep palpation. No rebound. No rigidity. Murphy sign was negative. No masses. No hernias.  SKIN: No ulcers. No subcutaneous nodules.  MUSCULOSKELETAL: No joint swelling. No clubbing.  NEUROLOGIC: Cranial nerves II through XII were intact. No focal motor deficit.  PSYCHIATRIC: The patient is alert, oriented x3. Mood and affect were normal.   LABORATORY FINDINGS: Her ultrasound of the abdomen showed minimal gallbladder sludge but no evidence of cholelithiasis or cholecystitis. Small aneurysm in the distal abdominal aorta measures 2.7 cm. Her EKG showed normal sinus rhythm at rate of 93 per minute. Ventricular premature complexes. Poor progression of R waves in the anterior chest leads. Otherwise unremarkable EKG. Serum glucose 93, BUN 3, creatinine 0.7, sodium 138, potassium 3.4. Total protein 8.5, albumin 4.3. Normal liver transaminases and normal bilirubin. Troponin less than 0.02. CBC showed white count of 9000,  hemoglobin 13, hematocrit 41, platelet count 330. Urinalysis was unremarkable.   ASSESSMENT:  1. Epigastric pain, more consistent with peptic ulcer disease versus gastritis. Differential diagnosis also includes acute pancreatitis; however, the lipase came back to be normal at 127.  2. Mild sludge in the gallbladder. This most likely is incidental finding. She is not tender at the right upper quadrant area. Murphy sign is negative. Pain is nonradiating.  3. Coronary artery disease as described above, status post stent implant.  4. Systemic hypertension.  5. Hyperlipidemia.  6. Small abdominal aortic aneurysm 2.7 cm in diameter.  7. Her other medical problems include fibromyalgia, depression and history of hiatal hernia.    PLAN: Will admit the patient to the medical floor. Keep n.p.o. Start IV Protonix 40 mg twice a day. Obtain GI consultation. I ordered a HIDA scan. Pain control. I will continue the rest of her home medications as listed above. For deep vein thrombosis prophylaxis, will use TED stockings.   Time spent in evaluating this patient took more than 55 minutes.    ____________________________ Carney Corners. Rudene Re, MD amd:gb D: 04/09/2013 03:55:01 ET T: 04/09/2013 04:19:32 ET JOB#: [id number]  cc: Carney Corners. Rudene Re, MD, <Dictator> Zollie Scale MD ELECTRONICALLY SIGNED 04/09/2013 7:51

## 2015-03-05 NOTE — Consult Note (Signed)
General Aspect 49 year old female with significant past medical history of coronary artery disease status post multiple stents, with most recent stent in January 2014 at [facility] to her proximal LCX, history of gastroesophageal reflux disease, fibromyalgia, hypertension, hyperlipidemia, who presents with complaint of chest pain. Cardiology was consulted for angina.  Admitted to [facility] on February 27th complaining of chest pressure with negative nuclear medicine SPECT scan as per [facility] documentation. The patient presents today complaining of chest pain. Woke her up from sleep last night. Developed at rest. Described it as sharp, substernal in the left side, radiating to the jaw and left arm. She took multiple nitroglycerin x4 at home without much improvement. EMS and the ED also gave her NTG,  with minimal improvement of her chest pain. In the ER, EKG showed new T wave inversion in the inferolateral leads. she was given 324 of aspirin and started on heparin drip out of concern of acute coronary syndrome.   chest pain was accompanied by nausea, sweating, shortness of breath. Denies any cough, any productive sputum, any palpitations or dizziness.  pain does not resemble the previous pain she had in January and February, as she described this more as actual pain. This AM,  the patient still complains of chest pain. She ate breakfast this AM.  Her blood pressure was on the lower side, so she could not be started on a nitroglycerin drip.  The patient reports she has been compliant with her medication.   Physical Exam:  GEN well developed, well nourished, no acute distress   HEENT red conjunctivae   NECK supple  No masses   RESP normal resp effort  clear BS   CARD Regular rate and rhythm  Normal, S1, S2  No murmur   ABD denies tenderness  soft   LYMPH negative neck   EXTR negative edema   SKIN normal to palpation   NEURO follows commands, motor/sensory function intact   PSYCH alert, A+O to  time, place, person, good insight   Review of Systems:  Subjective/Chief Complaint Chest pain, ongoing   General: No Complaints   Skin: No Complaints   ENT: No Complaints   Eyes: No Complaints   Neck: No Complaints   Respiratory: No Complaints   Cardiovascular: Chest pain or discomfort   Gastrointestinal: No Complaints   Genitourinary: No Complaints   Vascular: No Complaints   Musculoskeletal: No Complaints   Neurologic: No Complaints   Hematologic: No Complaints   Endocrine: No Complaints   Psychiatric: No Complaints   Review of Systems: All other systems were reviewed and found to be negative   Medications/Allergies Reviewed Medications/Allergies reviewed     Other, see comments: spinal cord stimulator   AAA - Abdominal Aortic Anuerysm:    kidney stones:    Back Pain, Chronic:    orthopnea:    Restless Leg Syndrome:    Fibromyalgia:    ulcer:    hiatal hernia:    Myocardial Infarct:    Depression:    GERD - Esophageal Reflux:    Hypercholesterolemia:    Hypertension:    Stent, Cardiac:    Other, see comments: neuromodulation, 20-Apr-2011   bunion removal, heel spur removal right foot:    nerve surgery on back:    Tubal Ligation:    C-Section:    Hysterectomy - Partial:        Admit Reason:   Acute coronary syndrome (411.1): Status: Active, Coding System: ICD9, Coded Name: Intermediate coronary syndrome  Home  Medications: Medication Instructions Status  acetaminophen-oxycodone 325 mg-7.5 mg oral tablet 1 tab(s) orally 1 to 4 times a day Active  acetaminophen-oxycodone 325 mg-7.5 mg oral tablet 1 tab(s) orally 1 to 4 times a day Active  acetaminophen-oxycodone 325 mg-7.5 mg oral tablet 1 tab(s) orally 1 to 4 times a day Active  acetaminophen-oxycodone 325 mg-7.5 mg oral tablet 1 tab(s) orally 1 to 4 times a day Active  metoprolol succinate 25 mg oral tablet, extended release 1 tab(s) orally once a day Active  aspirin 81 mg  oral tablet 1 tab(s) orally once a day Active  omeprazole 20 mg oral delayed release capsule 1 cap(s) orally 2 times a day Active  Proventil HFA 90 microgram(s) inhaled  2 puffs every 4 hours Active  nitroglycerin 0.4 milligram(s) orally , As Needed- for Chest Pain  Active  famotidine 40 mg oral tablet 1 tab(s) orally 2 times a day Active  Lipitor 80 mg oral tablet 1 tab(s) orally once a day (at bedtime) Active  Plavix 75 mg oral tablet 1 tab(s) orally once a day Active  Norvasc 5 mg oral tablet 1 tab(s) orally once a day Active  Requip 5 mg oral tablet  orally once a day (at bedtime) Active  enalapril 10 mg oral tablet  orally 2 times a day Active   Lab Results: Routine Chem:  26-Mar-14 23:11   Potassium, Serum  2.7  Glucose, Serum  110  BUN 7  Creatinine (comp)  0.55  Sodium, Serum 142  Chloride, Serum  110  CO2, Serum 25  Calcium (Total), Serum 8.7  Anion Gap 7  Osmolality (calc) 282  eGFR (African American) >60  eGFR (Non-African American) >60 (eGFR values <45m/min/1.73 m2 may be an indication of chronic kidney disease (CKD). Calculated eGFR is useful in patients with stable renal function. The eGFR calculation will not be reliable in acutely ill patients when serum creatinine is changing rapidly. It is not useful in  patients on dialysis. The eGFR calculation may not be applicable to patients at the low and high extremes of body sizes, pregnant women, and vegetarians.)  Cardiac:  26-Mar-14 23:11   CK, Total 58  CPK-MB, Serum 0.7 (Result(s) reported on 06 Feb 2013 at 12:55AM.)  Troponin I < 0.02 (0.00-0.05 0.05 ng/mL or less: NEGATIVE  Repeat testing in 3-6 hrs  if clinically indicated. >0.05 ng/mL: POTENTIAL  MYOCARDIAL INJURY. Repeat  testing in 3-6 hrs if  clinically indicated. NOTE: An increase or decrease  of 30% or more on serial  testing suggests a  clinically important change)  27-Mar-14 07:05   CK, Total 38  CPK-MB, Serum 1.1 (Result(s) reported on 06 Feb 2013 at 07:32AM.)  Troponin I < 0.02 (0.00-0.05 0.05 ng/mL or less: NEGATIVE  Repeat testing in 3-6 hrs  if clinically indicated. >0.05 ng/mL: POTENTIAL  MYOCARDIAL INJURY. Repeat  testing in 3-6 hrs if  clinically indicated. NOTE: An increase or decrease  of 30% or more on serial  testing suggests a  clinically important change)  Routine Coag:  26-Mar-14 23:11   Activated PTT (APTT) 33.6 (A HCT value >55% may artifactually increase the APTT. In one study, the increase was an average of 19%. Reference: "Effect on Routine and Special Coagulation Testing Values of Citrate Anticoagulant Adjustment in Patients with High HCT Values." American Journal of Clinical Pathology 2006;126:400-405.)  Prothrombin 12.6  INR 0.9 (INR reference interval applies to patients on anticoagulant therapy. A single INR therapeutic range for coumarins is not optimal for  all indications; however, the suggested range for most indications is 2.0 - 3.0. Exceptions to the INR Reference Range may include: Prosthetic heart valves, acute myocardial infarction, prevention of myocardial infarction, and combinations of aspirin and anticoagulant. The need for a higher or lower target INR must be assessed individually. Reference: The Pharmacology and Management of the Vitamin K  antagonists: the seventh ACCP Conference on Antithrombotic and Thrombolytic Therapy. TIRWE.3154 Sept:126 (3suppl): [id number]. A HCT value >55% may artifactually increase the PT.  In one study,  the increase was an average of 25%. Reference:  "Effect on Routine and Special Coagulation Testing Values of Citrate Anticoagulant Adjustment in Patients with High HCT Values." American Journal of Clinical Pathology 2006;126:400-405.)  Routine Hem:  26-Mar-14 23:11   WBC (CBC)  17.7  RBC (CBC) 4.78  Hemoglobin (CBC) 13.7  Hematocrit (CBC) 41.7  Platelet Count (CBC) 342 (Result(s) reported on 05 Feb 2013 at 11:36PM.)  MCV 87  MCH 28.7  MCHC  33.0  RDW  15.3   EKG:  Interpretation EKG shows NSR with St and T wave ABN in anterolateral leads, I and AVL   Radiology Results: XRay:    26-Mar-14 23:05, Chest Portable Single View  Chest Portable Single View   REASON FOR EXAM:    cp  COMMENTS:       PROCEDURE: DXR - DXR PORTABLE CHEST SINGLE VIEW  - Feb 05 2013 11:05PM     RESULT: Comparison is made to the previous exam dated 22 November 2012.    The projection is lordotic. Cardiac monitoring electrodes arepresent.   The lungs are clear. The heart and pulmonary vessels are normal. The bony   and mediastinal structures are unremarkable. There is no effusion. There   is no pneumothorax or evidence of congestive failure.    IMPRESSION:  No acute cardiopulmonary disease.    Dictation Site: 2    Verified By: Sundra Aland, M.D., MD    Levaquin: Swelling  Sulfa drugs: Hives  Neurontin: Rash, Swelling  Ranexa: Itching, Swelling  Flexeril: Muscles aches  Toradol: Muscles aches, Restless legs  Amoxicillin: N/V/Diarrhea  Latex: Itching, Rash  Tape: Rash, Other  Vital Signs/Nurse's Notes: **Vital Signs.:   27-Mar-14 11:20  Vital Signs Type Routine  Temperature Temperature (F) 98  Celsius 36.6  Temperature Source oral  Pulse Pulse 74  Respirations Respirations 18  Systolic BP Systolic BP 008  Diastolic BP (mmHg) Diastolic BP (mmHg) 69  Mean BP 81  Pulse Ox % Pulse Ox % 97  Pulse Ox Activity Level  At rest  Oxygen Delivery Room Air/ 21 %    Impression 49 year old female with significant past medical history of coronary artery disease status post multiple stents, with most recent stent in January 2014 at [facility] to her proximal LCX, history of gastroesophageal reflux disease, fibromyalgia, hypertension, hyperlipidemia, who presents with complaint of chest pain. Cardiology was consulted for angina.  1) Angina concerning in the setting of known CAD. New EKG changes. Unable to eleviate her pain with  morphine, heparin, ASA, NTG After discussing her treatment options, will proceed with cardiac cath. Her RCA is occluded, currently cardiac function being supplied by LAD and LCX. Now with new EKG changes in LCX territory. --NPO, order in for cath Pain control, she is requesting her outpt percocet  2) CAD, PCI recent pci to the LCX  3) GERD continue outpt meds.   Electronic Signatures: Ida Rogue (MD)  (Signed 27-Mar-14 13:30)  Authored: General Aspect/Present Illness, History and  Physical Exam, Review of System, Past Medical History, Health Issues, Home Medications, Labs, EKG , Radiology, Allergies, Vital Signs/Nurse's Notes, Impression/Plan   Last Updated: 27-Mar-14 13:30 by Ida Rogue (MD)

## 2015-03-05 NOTE — Consult Note (Signed)
General Aspect 49 year old female with significant past medical history of coronary artery disease status post multiple stents, with most recent stent in January 2014 at [facility] to her proximal LCX, angioplasty? in 04/14,  history of gastroesophageal reflux disease, fibromyalgia, hypertension, hyperlipidemia, who presents with complaint of chest pain. Cardiology was consulted for angina.  Admitted to [facility] on February 27th complaining of chest pressure with negative nuclear medicine SPECT scan as per [facility] documentation.  She has multiple admision for chest and abdominal pain. Was admitted here in 01/2013. I did cardiac cath which showed chronically occluded RCA with patent stent in LCX without obstructive disease.  She reports being admitted to [facility] after that with chest pain. Cardiac cath and angioplasty were performed. These details are not available.  She came with chest tightness this morning which has been going on for few hours. Negative TnI so far. ECG with nonspecific changes.   Physical Exam:  GEN well developed, well nourished, no acute distress   HEENT red conjunctivae   NECK supple  No masses   RESP normal resp effort  clear BS   CARD Regular rate and rhythm  Normal, S1, S2  No murmur   ABD denies tenderness  soft   LYMPH negative neck   EXTR negative edema   SKIN normal to palpation   NEURO follows commands, motor/sensory function intact   PSYCH alert, A+O to time, place, person, good insight   Review of Systems:  Subjective/Chief Complaint Chest pain, ongoing   General: No Complaints   Skin: No Complaints   ENT: No Complaints   Eyes: No Complaints   Neck: No Complaints   Respiratory: No Complaints   Cardiovascular: Chest pain or discomfort   Gastrointestinal: No Complaints   Genitourinary: No Complaints   Vascular: No Complaints   Musculoskeletal: No Complaints   Neurologic: No Complaints   Hematologic: No Complaints   Endocrine: No  Complaints   Psychiatric: No Complaints   Review of Systems: All other systems were reviewed and found to be negative   Medications/Allergies Reviewed Medications/Allergies reviewed     Other, see comments: spinal cord stimulator   AAA - Abdominal Aortic Anuerysm:    kidney stones:    Back Pain, Chronic:    orthopnea:    Restless Leg Syndrome:    Fibromyalgia:    ulcer:    hiatal hernia:    Myocardial Infarct:    Depression:    GERD - Esophageal Reflux:    Hypercholesterolemia:    Hypertension:    Stent, Cardiac:    Other, see comments: neuromodulation, 20-Apr-2011   bunion removal, heel spur removal right foot:    nerve surgery on back:    Tubal Ligation:    C-Section:    Hysterectomy - Partial:   Home Medications: Medication Instructions Status  aspirin 81 mg oral tablet 1 tab(s) orally once a day Active  Lipitor 80 mg oral tablet 1 tab(s) orally once a day (at bedtime) Active  Plavix 75 mg oral tablet 1 tab(s) orally once a day Active  acetaminophen-oxycodone 325 mg-7.5 mg oral tablet 1 tab(s) orally 1 to 4 times a day Active  Norvasc 5 mg oral tablet 1 tab(s) orally once a day Active  metoprolol succinate 50 mg oral tablet, extended release 1 tab(s) orally once a day (at bedtime) Active  Carafate 1 g oral tablet 1 tab(s) orally 4 times a day (before meals and at bedtime) Active  Protonix 20 mg oral delayed release tablet 1  tab(s) orally once a day Active  enalapril 10 mg oral tablet 1 tab(s) orally 2 times a day Active  nitroglycerin 0.4 mg sublingual tablet 1 tab(s) sublingual every 5 minutes, As Needed - for Chest Pain Active  Proventil HFA CFC free 90 mcg/inh inhalation aerosol 2 puff(s) inhaled every 4 hours, As Needed - for Shortness of Breath Active  Requip 5 mg oral tablet 1 tab(s) orally once a day (at bedtime) Active   Lab Results: Routine Chem:  23-Jun-14 09:28   Glucose, Serum 93  BUN  6  Creatinine (comp) 0.72  Sodium, Serum 141   Potassium, Serum 3.6  Chloride, Serum  110  CO2, Serum 25  Calcium (Total), Serum 9.5  Anion Gap  6  Osmolality (calc) 279  eGFR (African American) >60  eGFR (Non-African American) >60 (eGFR values <36mL/min/1.73 m2 may be an indication of chronic kidney disease (CKD). Calculated eGFR is useful in patients with stable renal function. The eGFR calculation will not be reliable in acutely ill patients when serum creatinine is changing rapidly. It is not useful in  patients on dialysis. The eGFR calculation may not be applicable to patients at the low and high extremes of body sizes, pregnant women, and vegetarians.)  Cardiac:  23-Jun-14 09:28   CK, Total 86  CPK-MB, Serum  < 0.5 (Result(s) reported on 05 May 2013 at 10:09AM.)  Troponin I < 0.02 (0.00-0.05 0.05 ng/mL or less: NEGATIVE  Repeat testing in 3-6 hrs  if clinically indicated. >0.05 ng/mL: POTENTIAL  MYOCARDIAL INJURY. Repeat  testing in 3-6 hrs if  clinically indicated. NOTE: An increase or decrease  of 30% or more on serial  testing suggests a  clinically important change)  Routine Coag:  23-Jun-14 09:28   Activated PTT (APTT) 34.3 (A HCT value >55% may artifactually increase the APTT. In one study, the increase was an average of 19%. Reference: "Effect on Routine and Special Coagulation Testing Values of Citrate Anticoagulant Adjustment in Patients with High HCT Values." American Journal of Clinical Pathology 2006;126:400-405.)  Prothrombin 12.4  INR 0.9 (INR reference interval applies to patients on anticoagulant therapy. A single INR therapeutic range for coumarins is not optimal for all indications; however, the suggested range for most indications is 2.0 - 3.0. Exceptions to the INR Reference Range may include: Prosthetic heart valves, acute myocardial infarction, prevention of myocardial infarction, and combinations of aspirin and anticoagulant. The need for a higher or lower target INR must be assessed  individually. Reference: The Pharmacology and Management of the Vitamin K  antagonists: the seventh ACCP Conference on Antithrombotic and Thrombolytic Therapy. FUXNA.3557 Sept:126 (3suppl): [id number]. A HCT value >55% may artifactually increase the PT.  In one study,  the increase was an average of 25%. Reference:  "Effect on Routine and Special Coagulation Testing Values of Citrate Anticoagulant Adjustment in Patients with High HCT Values." American Journal of Clinical Pathology 2006;126:400-405.)  Routine Hem:  23-Jun-14 09:28   WBC (CBC) 10.5  RBC (CBC) 4.85  Hemoglobin (CBC) 13.8  Hematocrit (CBC) 40.9  Platelet Count (CBC) 282 (Result(s) reported on 05 May 2013 at 09:50AM.)  MCV 84  MCH 28.3  MCHC 33.6  RDW  15.8   EKG:  Interpretation EKG shows NSR with nonspecific anterolateral ST changes (not new)   EKG Comparision Not changed from   Radiology Results: XRay:    23-Jun-14 09:42, Chest PA and Lateral  Chest PA and Lateral   REASON FOR EXAM:    Chest Pain  COMMENTS:  PROCEDURE: DXR - DXR CHEST PA (OR AP) AND LATERAL  - May 05 2013  9:42AM     RESULT: Comparison is made to the previous exam dated 02/05/2013.    The lungs are mildly hyperinflated but appear to be clear. Spinal canal   neurostimulator's are present. The lungs are clear. The heart and   pulmonary vessels are normal. The bony and mediastinal structures are   unremarkable. There is no effusion. There is no pneumothorax or evidence   of congestive failure. Prominent coronary artery calcification is   demonstrated. Correlate clinically.    IMPRESSION:  No acute cardiopulmonary disease. A there is a her artery     atherosclerotic calcification. Further investigation may be warranted.    Dictation Site: 2(*)        Verified By: Sundra Aland, M.D., MD    Levaquin: Swelling  Sulfa drugs: Hives  Neurontin: Rash, Swelling  Ranexa: Itching, Swelling  Flexeril: Muscles aches  Toradol:  Muscles aches, Restless legs  Amoxicillin: N/V/Diarrhea  Latex: Itching, Rash  Tape: Rash, Other  Vital Signs/Nurse's Notes: **Vital Signs.:   23-Jun-14 16:16  Vital Signs Type Routine  Temperature Temperature (F) 97.8  Celsius 36.5  Temperature Source oral  Pulse Pulse 62  Respirations Respirations 20  Systolic BP Systolic BP 886  Diastolic BP (mmHg) Diastolic BP (mmHg) 69  Mean BP 87  Pulse Ox % Pulse Ox % 98  Pulse Ox Activity Level  At rest  Oxygen Delivery Room Air/ 21 %    Impression 49 year old female with significant past medical history of coronary artery disease status post multiple stents, with most recent stent in January 2014 at [facility] to her proximal LCX and possible angioplasty in April, history of gastroesophageal reflux disease, fibromyalgia, hypertension, hyperlipidemia, who presents with complaint of chest pain.   1) chest pain: multiple presentations with this and other pain complaints. She reports having angioplasty done at [facility] after the cardiac cath that I did in March which did not show new obstructive disease. Will need to get these records from [facility] before doing any more ischemic work up.  I do feel that the patient has a component of pain syndrome with hospital admission in multiple places.   2) CAD, PCI continue medical therapy.   3) GERD continue outpt meds.   Electronic Signatures: Kathlyn Sacramento (MD)  (Signed 23-Jun-14 18:51)  Authored: General Aspect/Present Illness, History and Physical Exam, Review of System, Past Medical History, Home Medications, Labs, EKG , Radiology, Allergies, Vital Signs/Nurse's Notes, Impression/Plan   Last Updated: 23-Jun-14 18:51 by Kathlyn Sacramento (MD)

## 2015-03-05 NOTE — H&P (Signed)
PATIENT NAME:  Kristin Coleman, Kristin Coleman MR#:  [id number] DATE OF BIRTH:  02-02-1966  DATE OF ADMISSION:  07/01/2013  ADMITTING PHYSICIAN: Gladstone Lighter, M.D.   PRIMARY CARE PHYSICIAN: At [facility].   CHIEF COMPLAINT: Chest pain.   HISTORY OF PRESENT ILLNESS: Kristin Coleman is a 49 year old Caucasian female with past medical history significant for hypertension, fibromyalgia, coronary artery disease, status post stents in the past, most recent stents at [facility] in 11/2012, gastroesophageal reflux disease, hyperlipidemia, chronic back pain, followed at the Pain clinic, comes to the hospital secondary to chest pain. According to the patient, this pain had sudden onset at 3:00 a.m. this morning and woke her up from sleep. The patient has had multiple admissions to [facility] and also to [facility] for chest pain. She is in a pain contract with the Pain Management clinic and cannot get any prescription narcotics as an outpatient other than what she was given by the pain management clinic. Anyway, the patient has had multiple tests done, including a recent EGD for her epigastric pain at [facility]. Last cardiac catheterization in 11/2012 at [facility] with chronically occluded disease of the RCA with patent stents and multiple stress tests in the past. She does not appear to be diaphoretic.  Her first troponin is negative. No shortness of breath noted. She just seems to be in a lot of pain and her pain according to her is more like in the lower sternal to epigastric region and was radiating down the arm. She denies any local tenderness. No fevers or chills. No nausea or vomiting.   PAST MEDICAL HISTORY: 1.  Coronary artery disease, status post stents, the last stents being in 11/2012, cardiac catheterization in 01/2013 showing chronically occluded RCA with patent stents present. 2.  Hypertension.  2.  Fibromyalgia.  3.  Chronic back pain status post back surgery.  4.  Hyperlipidemia.  5.  History of AAA. 6.  Gastroesophageal reflux  disease.  7.  Hiatal hernia.  8.  Renal stones.  9.  Adrenal insufficiency.  PAST SURGICAL HISTORY:  1.  Hysterectomy.  2.  Cardiac stent placement.  3.  Back surgery.  4.  Tubal ligation.  5.  Ankle surgeries.   ALLERGIES: AMOXICILLIN, FLEXERIL, LEVAQUIN, NEURONTIN, RANEXA, SULFA DRUGS, TORADOL, LATEX, AN TAPE.    CURRENT HOME MEDICATIONS:  1.  Percocet 7.5/325 mg 1 tablet 1 to 4 times a day.  2.  Aspirin 81 mg p.o. daily.  3.  Carafate 1 gram p.o. 4 times a day before meals and at bedtime.  4.  Hydrocortisone 25 mg at bedtime.  5.  Hydrocortisone 75 mg in the morning.  6.  Lipitor 80 mg p.o. daily.  7.  Toprol 50 mg p.o. at bedtime.  8.  Sublingual nitroglycerin 0.4 mg for chest pain  as needed every 5 minutes.  9.  Plavix 75 mg p.o. daily.  10.  Protonix 20 mg p.o. daily.  11. Proventil inhaler 2 puffs q.4 hours p.r.n.  12.  Requip 2 mg p.o. 3 times a day.   SOCIAL HISTORY: Lives at home with her husband. Quit smoking several years ago. No alcohol use.   FAMILY HISTORY: Dad died from heart disease in 24s. Maternal grandfather from heart disease in 33s and mom still living and has high blood pressure and breast cancer.   REVIEW OF SYSTEMS:  CONSTITUTIONAL: No fever, fatigue or weakness.  EYES: No blurred vision, double vision, inflammation, glaucoma or cataracts.  ENT: No tinnitus, ear pain, hearing loss, epistaxis  or discharge.  RESPIRATORY: No cough, wheeze, hemoptysis or COPD.  CARDIOVASCULAR: Positive for chest pain. No orthopnea, edema. No palpitations or syncope.  GASTROINTESTINAL: Positive for nausea. No vomiting, diarrhea, abdominal pain, hematemesis or melena.  GENITOURINARY: No dysuria, hematuria, renal calculus, frequency or incontinence.  ENDOCRINE: No polyuria, nocturia, thyroid problems, heat or cold intolerance.  HEMATOLOGY: No anemia, easy bruising or bleeding.  SKIN: No acne, rash or lesions.  MUSCULOSKELETAL: Positive for back pain. No gout or arthritis.   NEUROLOGIC:  No numbness, weakness, CVA, TIA or seizures.  PSYCHOLOGICAL: No anxiety, insomnia, depression.   PHYSICAL EXAMINATION: VITAL SIGNS: Temperature 96.8 degrees Fahrenheit, pulse 90, respirations 18, blood pressure 178/88, pulse ox 98% on room air.  GENERAL: Well-built, well-nourished female lying in bed, not in any acute distress.  HEENT: Normocephalic, atraumatic. Pupils are equal, round, reacting to light. Anicteric sclerae. Extraocular movements intact. Oropharynx clear without erythema, mass or exudates.  NECK: Supple. No thyromegaly, JVD or carotid bruits. No lymphadenopathy.  LUNGS: Moving air bilaterally. No wheeze or crackles. No use of accessory muscles for breathing.  CARDIOVASCULAR: S1, S2 regular rate and rhythm. No murmurs, rubs or gallops. No chest wall tenderness.  ABDOMEN: Soft, nontender, mild discomfort in the epigastric region. No hepatosplenomegaly.  Normal bowel sounds.  EXTREMITIES: No pedal edema. No clubbing or cyanosis. 2+ dorsalis pedis pulses palpable bilaterally.  SKIN: No acne, rash or lesions.  MUSCULOSKELETAL: No neck, back, shoulder pain, arthritis or gout.  LYMPHATICS: No cervical or inguinal lymphadenopathy.  NEUROLOGIC: Cranial nerves II through XII remain intact. No focal motor or sensory deficits.  PSYCHOLOGICAL: The patient is awake, alert, oriented x3.   LABORATORY DATA: WBC 15.0, hemoglobin 12.5, hematocrit 37.9, platelet count 329.   Sodium 140, potassium 3.6, chloride 108, bicarbonate 25, BUN 9, creatinine 0.6, glucose 82 and calcium of 9.5.   ALT 17, AST 12, alk phos 71, total bilirubin 0.3, albumin of 3.6. Troponin is less than 0.02. Chest x-ray showing no acute cardiopulmonary abnormality. She had an EKG showing normal sinus rhythm, heart rate of 90, no acute ST-T wave abnormalities.   ASSESSMENT AND PLAN: This is a 47-year-old female with past medical history of coronary artery disease, status post stents, most recent in 11/2012,  fibromyalgia, hypertension, hyperlipidemia, multiple admissions for chest pain lately, comes in again for chest pain that started this morning.   1.  Chest pain. Could be gastritis as pain is more in the epigastric region radiating to the arm though.  The patient was started on a nitro drip in the ER with no relief so we will discontinue the nitro drip.  We are admitting under observation. We will cycle cardiac enzymes, hold off on cardiology consult unless enzymes are positive. No stress test as well because less likely to be cardiac in nature. We will get an echocardiogram. Continue cardiac medications and aspirin, Plavix, statin and metoprolol. No IV narcotics for the patient.  We will do nitro paste for pain.  2.  Fibromyalgia. Her pain management physician who follows the patient was contacted, and they recommended no prescriptions at discharge per narcotics and no long acting  narcotics. The patient is already on Percocet at home, so that only needs to be given p.r.n. while in the hospital. We will only give her 1 day and discontinue after 24 hours.    3.  Hypertension. Currently on medications.  4.  Hyperlipidemia. She is on statin.  5.  Adrenal insufficiency. Continued p.o. hydrocortisone.  6.  Leukocytosis. Could be   stress reaction. She is also is on steroids at home. Her discharge white count last time was also 15,000. We will check a urinalysis to rule out urinary tract infection.   7.  Gastroesophageal reflux disease.  Recent EGD done at [facility]. Continue Carafate and change Protonix to IV while in the hospital.  8.  Deep venous thrombosis prophylaxis with subcutaneous heparin.   CODE STATUS: Full code.   TIME SPENT ON ADMISSION: 50 minutes.    ____________________________ Radhika Kalisetti, MD rk:dp D: 07/01/2013 09:52:58 ET T: 07/01/2013 10:34:46 ET JOB#: [id number]  cc: Radhika Kalisetti, MD, <Dictator> RADHIKA KALISETTI MD ELECTRONICALLY SIGNED 07/07/2013 22:17 

## 2015-03-05 NOTE — Discharge Summary (Signed)
PATIENT NAME:  Kristin Coleman, PILAND MR#:  [id number] DATE OF BIRTH:  February 28, 1966  DATE OF ADMISSION:  02/06/2013 DATE OF DISCHARGE:  02/07/2013  PRESENTING COMPLAINT: Chest pain.   DISCHARGE DIAGNOSES: 1.  Unstable angina, with history of severe coronary artery disease.  2.  Hypertension.  3.  Cardiac catheterization showed 2-vessel disease with patent stent in the left circumflex and multiple stents in the right coronary artery. No new lesions noted. Medical management recommended per Dr. Kirke Corin.  4.  CODE STATUS: Full code.   MEDICATIONS: 1.  Aspirin 81 mg daily.  2.  Omeprazole 20 mg b.i.d.  3.  Famotidine 40 mg b.i.d.  4.  Proventil HFA 90 mcg inhalation 2 puffs every 4 hours.  5. Nitroglycerin 0.4 mg sublingual as needed.  6.  Acetaminophen/oxycodone 325/7.5, 1 tablet 4 times a day as needed.  7.  Metoprolol succinate 25 mg extended-release daily.  8.  Lipitor 80 mg at bedtime.  9.  Plavix 75 mg daily.  10.  Norvasc 5 mg daily.  11.  Requip 5 mg at bedtime.  12.  Enalapril 10 mg b.i.d.  13.  Tramadol 50 mg every four hours as needed.  14.  NitroQuick sprays 0.4 mg one think sublingual every 5 minutes as needed.   DIET: Low fat, low cholesterol.   FOLLOWUP: Follow up with Dr. Rolm Gala at [facility] in 2 weeks and follow up with Dr. Kirke Corin in 1 to 2 weeks.   PROCEDURES:  1.  Cardiac catheterization, results as above noted.  2. Lipid profile within normal limits. CBC within normal limits. Basic metabolic panel within normal limits except glucose 119, chloride 109, calcium 8.3. Cardiac enzymes x 3 negative. Urinalysis negative for urinary tract infection. Chest x-ray is no acute cardiopulmonary disease. EKG shows sinus tachycardia, ST-T changes and consider anterolateral ischemia. 3.  Cardiology consultation with Dr. Kirke Corin and Dr. Mariah Milling.   BRIEF SUMMARY OF HOSPITAL COURSE:  The patient is a 49 year old Caucasian female with long-standing history of coronary artery  disease with multiple stents in the past who comes in with:   1.  Unstable angina, with normal EKG changes. The patient was admitted on telemetry floor, started on heparin drip. She was continued on aspirin, Plavix, statins, enalapril, metoprolol and sublingual nitroglycerin. She had a recent stent placement at [facility] in June 2013. Dr. Kirke Corin recommended a catheterization; however, did not see any significant new lesion and hence, medical regimen was continued. 2.  Hypokalemia, replaced. 3.  Leukocytosis, appears reactive.  4.  Hyperlipidemia continued on statins.  5.  Hypertension. The patient tolerated amlodipine, metoprolol and enalapril.   6.  DVT prophylaxis. The patient was on aspirin, Plavix and full dose IV heparin as part of ACS treatment. She is otherwise clinically remained stable.   CONDITION ON DISCHARGE:  She is discharged to home in a stable condition.   TIME SPENT: 40 minutes.     ____________________________ Wylie Hail Allena Katz, MD sap:ct D: 02/08/2013 12:48:16 ET T: 02/08/2013 14:37:20 ET JOB#: [id number]  cc: Shell Yandow A. Allena Katz, MD, <Dictator> Letitia Caul, MD Willow Ora MD ELECTRONICALLY SIGNED 02/14/2013 15:45

## 2015-03-05 NOTE — Discharge Summary (Signed)
PATIENT NAME:  Kristin Coleman, Kristin Coleman MR#:  [id number] DATE OF BIRTH:  Feb 02, 1966  DATE OF ADMISSION:  05/05/2013 DATE OF DISCHARGE:  05/06/2013  CONSULTANTS:  Dr. Kirke Corin and Dr. Mariah Milling from [facility] cardiology.   CHIEF COMPLAINT:  Chest pain.   PRIMARY CARE PHYSICIAN:  Dr. Gavin Potters.   DISCHARGE DIAGNOSES: 1.  Chest pain, possibly not cardiac as she is ruled out for acute coronary syndrome.  2.  Hypertension.  3.  Gastroesophageal reflux disease.  4.  History of coronary artery disease status post stent placement, two vessel disease with multiple recent cardiac catheterizations.  5.  Hyperlipidemia.  6.  Abdominal aortic aneurysm. 7.  Gastroesophageal reflux disease.  8.  Hiatal hernia.  9.  Fibromyalgia.  10.  Depression.  11.  History of nephrolithiasis.   DISCHARGE MEDICATIONS: Aspirin 81 mg daily, Lipitor 80 mg daily, Plavix 75 mg daily, Norvasc 5 mg daily, metoprolol succinate 50 mg once a day, Carafate 1 gram 1 tab 4 times a day, Protonix 20 mg daily, enalapril 10 mg 2 times a day, Proventil HFA 2 puffs every 4 hours as needed for shortness of breath, Requip 5 mg once a day, nitroglycerin sublingual every 5 minutes 1 tab as needed for chest pain.   DIET:  Low sodium, low fat, low cholesterol.   ACTIVITY:  As tolerated.   FOLLOWUP:  Please follow with PCP within 1 to 2 weeks. Please follow with cardiologist within 1 to 2 weeks.   HISTORY OF PRESENT ILLNESS AND HOSPITAL COURSE:  For full details of H and P, please see the dictation by Dr. Imogene Burn on June 23rd. This is a 49 year old female with history of CAD, status post stenting, who came in with chest pain, which was sharp, constant, radiated to the left arm. It was relieved with nitroglycerin. She was admitted to the hospitalist service with suspected unstable angina, started on heparin drip. The patient had a cardiac cath in March. While hospitalized, she was seen by Dr. Kirke Corin, who performed the cath. She was ruled out for acute coronary  syndrome. The patient has had multiple presentations with same. The patient, of note, had stated that she had angioplasty done at [facility] after the cardiac cath performed here in March. Cardiology also felt the patient has component of pain syndrome with hospital admissions in multiple places. She was ruled out for acute coronary syndrome with multiple negative troponins and was seen by Dr. Mariah Milling the following day, and recommendation was to ambulate and discharge. She has run out of nitroglycerin, and she will be discharged with a prescription refill. She is to follow with her outpatient cardiology for further workup.   TOTAL TIME SPENT: 35 minutes.   CODE STATUS:  The patient is full code.   ____________________________ Krystal Eaton, MD sa:dmm D: 05/07/2013 08:49:21 ET T: 05/07/2013 09:35:00 ET JOB#: [id number]  cc: Krystal Eaton, MD, <Dictator> Letitia Caul, MD Krystal Eaton MD ELECTRONICALLY SIGNED 05/23/2013 14:58

## 2015-03-05 NOTE — Consult Note (Signed)
General Aspect 49 year old female with PMH of coronary artery disease status post multiple stents, with most recent stent in January 2014 at [facility] to her proximal LCX, possible angioplasty? in 04/14,  history of gastroesophageal reflux disease ("hiatal hernia"), fibromyalgia, hypertension, hyperlipidemia, chronic back pain, seen by the pain clinic, who presents with complaint of chest pain. Cardiology was consulted for chest pain. She also had ABD pain and nausea.     She has multiple admision for chest and abdominal pain.  Admitted to [facility] on February 27th 2014 complaining of chest pressure with negative nuclear medicine SPECT scan as per [facility] documentation.  admitted here in 01/2013 with cardiac cath which showed chronically occluded RCA with patent stent in LCX without obstructive disease.   admitted to [facility] after that with chest pain. Cardiac cath and angioplasty were performed. These details are not available.   Admitted June 2014 with chest tightness, ruled out, felt to be non cadiac sx. now presentind with similar sx of chest pain and ABD pain. She states NTG helped with two epsiodes of chest pain, then Chest paion in evening, NTb did not help.  In ER, she received pain meds. Nurse reports she is asking for phenergen and morphine. She was told phenergen was Q6.   Physical Exam:  GEN well developed, well nourished, no acute distress   HEENT red conjunctivae   NECK supple  No masses   RESP normal resp effort  clear BS   CARD Regular rate and rhythm  Normal, S1, S2  No murmur   ABD denies tenderness  soft   LYMPH negative neck   EXTR negative edema   SKIN normal to palpation   NEURO follows commands, motor/sensory function intact   PSYCH alert, A+O to time, place, person, good insight   Review of Systems:  Subjective/Chief Complaint Chest pain, ongoing   General: No Complaints   Skin: No Complaints   ENT: No Complaints   Eyes: No Complaints   Neck: No  Complaints   Respiratory: No Complaints   Cardiovascular: Chest pain or discomfort   Gastrointestinal: No Complaints   Genitourinary: No Complaints   Vascular: No Complaints   Musculoskeletal: No Complaints   Neurologic: No Complaints   Hematologic: No Complaints   Endocrine: No Complaints   Psychiatric: No Complaints   Review of Systems: All other systems were reviewed and found to be negative   Medications/Allergies Reviewed Medications/Allergies reviewed       Other, see comments: spinal cord stimulator   AAA - Abdominal Aortic Anuerysm:    kidney stones:    Back Pain, Chronic:    orthopnea:    Restless Leg Syndrome:    Fibromyalgia:    ulcer:    hiatal hernia:    Myocardial Infarct:    Depression:    GERD - Esophageal Reflux:    Hypercholesterolemia:    Hypertension:    Stent, Cardiac:    Stent - Cardiac:    Other, see comments: neuromodulation, 20-Apr-2011   bunion removal, heel spur removal right foot:    nerve surgery on back:    Tubal Ligation:    C-Section:    Hysterectomy - Partial:          Admit Diagnosis:   CHEST PAIN: Onset Date: 01-Jun-2013, Status: Active, Description: CHEST PAIN  Home Medications:  Medication Instructions Status  acetaminophen-oxycodone 325 mg-7.5 mg oral tablet 1 tab(s) orally 1 to 4 times a day Active  Diflucan 150 mg oral tablet 1  orally once  Active  nitroglycerin 0.4 mg sublingual tablet 1 tab(s) sublingual every 5 minutes, As Needed - for Chest Pain x15 tabs Active  aspirin 81 mg oral tablet 1 tab(s) orally once a day Active  Lipitor 80 mg oral tablet 1 tab(s) orally once a day (at bedtime) Active  Plavix 75 mg oral tablet 1 tab(s) orally once a day Active  Norvasc 5 mg oral tablet 1 tab(s) orally once a day Active  Requip 2 mg oral tablet 1 tab(s) orally 3 times a day Active  metoprolol succinate 50 mg oral tablet, extended release 1 tab(s) orally once a day (at bedtime) Active  Carafate  1 g oral tablet 1 tab(s) orally 4 times a day (before meals and at bedtime) Active  Protonix 20 mg oral delayed release tablet 1 tab(s) orally once a day Active  enalapril 10 mg oral tablet 1 tab(s) orally 2 times a day Active  Proventil HFA CFC free 90 mcg/inh inhalation aerosol 2 puff(s) inhaled every 4 hours, As Needed - for Shortness of Breath Active   Lab Results:  Routine Chem:  20-Jul-14 06:36   Glucose, Serum 97  BUN 16  Creatinine (comp) 0.77  Sodium, Serum 138  Potassium, Serum 3.9  Chloride, Serum 107  CO2, Serum 25  Calcium (Total), Serum 9.1  Anion Gap  6  Osmolality (calc) 277  eGFR (African American) >60  eGFR (Non-African American) >60 (eGFR values <68m/min/1.73 m2 may be an indication of chronic kidney disease (CKD). Calculated eGFR is useful in patients with stable renal function. The eGFR calculation will not be reliable in acutely ill patients when serum creatinine is changing rapidly. It is not useful in  patients on dialysis. The eGFR calculation may not be applicable to patients at the low and high extremes of body sizes, pregnant women, and vegetarians.)  Cholesterol, Serum 174  Triglycerides, Serum 183  HDL (INHOUSE) 47  VLDL Cholesterol Calculated 37  LDL Cholesterol Calculated 90 (Result(s) reported on 01 Jun 2013 at 07:09AM.)  Cardiac:  19-Jul-14 15:34   Troponin I 0.04 (0.00-0.05 0.05 ng/mL or less: NEGATIVE  Repeat testing in 3-6 hrs  if clinically indicated. >0.05 ng/mL: POTENTIAL  MYOCARDIAL INJURY. Repeat  testing in 3-6 hrs if  clinically indicated. NOTE: An increase or decrease  of 30% or more on serial  testing suggests a  clinically important change)    23:48   Troponin I 0.03 (0.00-0.05 0.05 ng/mL or less: NEGATIVE  Repeat testing in 3-6 hrs  if clinically indicated. >0.05 ng/mL: POTENTIAL  MYOCARDIAL INJURY. Repeat  testing in 3-6 hrs if  clinically indicated. NOTE: An increase or decrease  of 30% or more on serial   testing suggests a  clinically important change)  20-Jul-14 06:36   CK, Total 21  CPK-MB, Serum  < 0.5 (Result(s) reported on 01 Jun 2013 at 07:18AM.)  Troponin I < 0.02 (0.00-0.05 0.05 ng/mL or less: NEGATIVE  Repeat testing in 3-6 hrs  if clinically indicated. >0.05 ng/mL: POTENTIAL  MYOCARDIAL INJURY. Repeat  testing in 3-6 hrs if  clinically indicated. NOTE: An increase or decrease  of 30% or more on serial  testing suggests a  clinically important change)  Routine Hem:  20-Jul-14 06:36   WBC (CBC)  15.3  RBC (CBC) 4.09  Hemoglobin (CBC)  11.2  Hematocrit (CBC)  34.5  Platelet Count (CBC)  452  MCV 84  MCH 27.4  MCHC 32.5  RDW  16.8  Neutrophil % 66.0  Lymphocyte %  24.9  Monocyte % 6.4  Eosinophil % 1.5  Basophil % 1.2  Neutrophil #  10.1  Lymphocyte #  3.8  Monocyte #  1.0  Eosinophil # 0.2  Basophil #  0.2 (Result(s) reported on 01 Jun 2013 at 06:53AM.)   EKG:  Interpretation EKG shows NSR with nonspecific anterolateral ST changes, poor R wave progression through the anterior precordial leads, unable to exclude old inferior MI (not new)   EKG Comparision Not changed from   Radiology Results: CT:    19-Jul-14 16:51, CT Chest, Abd, and Pelvis With Contrast  CT Chest, Abd, and Pelvis With Contrast   REASON FOR EXAM:    (1) chest pain; (2) chest pain, known AAA  COMMENTS:   May transport without cardiac monitor    PROCEDURE: CT  - CT CHEST ABDOMEN AND PELVIS W  - May 31 2013  4:51PM     RESULT: CT CHEST, ABDOMEN, AND PELVIS    History: Chest pain    Comparison: 03/10/2013 CT abdomen, 11/23/2012 CT chest    Technique: Multiple axial images obtained from the thoracic inlet to the   pubic symphysis, without p.o. contrast and with 100 ml of Isovue-370   intravenous contrast.  Findings:    CHEST:    The lungs are clear. There is no focal mass. There is no focal   parenchymal opacity, pleural effusion, or pneumothorax.     The heart size is normal.  There is no pericardial effusion. There is   right coronary artery atherosclerosis. There is mild thoracic aortic   ectasia focally versus a penetrating ulcer at the level of the   diaphragmatic hiatus measuring 3.1 x 2.5 cm.    There are no pathologically enlarged mediastinal, hilar, or axillary   lymph nodes. There is a calcified subcarinal lymphnode.  The osseous structures demonstrate no focal abnormality.     ABDOMEN/PELVIS:    The liver demonstrates no focal abnormality. There is no intrahepatic or   extrahepatic biliary ductal dilatation. The gallbladder is unremarkable.   The spleen demonstrates no focal abnormality. The kidneys, adrenal   glands, pancreas are normal. The bladder is unremarkable.     The stomach, duodenum, small intestine, and large intestine demonstrate   no dilatation. There is no pneumoperitoneum, pneumatosis, or portal   venous gas. There is no abdominal or pelvic free fluid. There is no   lymphadenopathy.     There is abdominal aortic ectasia measuring 2.8 x 2.8 cm.  The osseous structures are unremarkable.    IMPRESSION:     1. Distal thoracic aortic ectasia focally versus a penetrating ulcer, at   the level of the diaphragmatic hiatus measuring 3.1 x 2.5 cm. This is   unchanged compared with 10/30/2012.    2. Infrarenal abdominal aortic ectasia measuring 2.8 x 2.8 cm.    Dictation Site: 1        Verified By: Jennette Banker, M.D., MD    Levaquin: Swelling  Sulfa drugs: Hives  Neurontin: Rash, Swelling  Ranexa: Itching, Swelling  Flexeril: Muscles aches  Toradol: Muscles aches, Restless legs  Amoxicillin: N/V/Diarrhea  Latex: Itching, Rash  Tape: Rash, Other  Vital Signs/Nurse's Notes: **Vital Signs.:   20-Jul-14 08:00  Vital Signs Type Routine  Temperature Temperature (F) 99  Celsius 37.2  Temperature Source oral  Pulse Pulse 68  Respirations Respirations 18  Systolic BP Systolic BP 96  Diastolic BP (mmHg) Diastolic BP (mmHg) 65   Mean BP 75  Pulse Ox %  Pulse Ox % 95  Pulse Ox Activity Level  At rest  Oxygen Delivery Room Air/ 21 %    Impression 49 year old female with significant past medical history of coronary artery disease status post multiple stents, with most recent stent in January 2014 at [facility] to her proximal LCX and possible angioplasty in April, history of gastroesophageal reflux disease, fibromyalgia, hypertension, hyperlipidemia, who presents with complaint of chest pain.   1) chest pain:  multiple presentations with this and other pain complaints, ABD. I do feel that the patient has a component of pain syndrome with hospital admission in multiple places.  no significant EKG changes, cardiac enz x 3 no further workup at this time  2) CAD, PCI continue medical therapy.   3) GERD continue outpt meds. carafate, omeprazole  4) Chronic pain; See the pain clinic  5) h/o UTI she is on amoxicillin for 10 days by outside physician   Electronic Signatures: Ida Rogue (MD)  (Signed 20-Jul-14 13:50)  Authored: General Aspect/Present Illness, History and Physical Exam, Review of System, Past Medical History, Health Issues, Home Medications, Labs, EKG , Radiology, Allergies, Vital Signs/Nurse's Notes, Impression/Plan   Last Updated: 20-Jul-14 13:50 by Ida Rogue (MD)

## 2015-03-05 NOTE — H&P (Signed)
PATIENT NAME:  Kristin Coleman, Kristin Coleman MR#:  [id number] DATE OF BIRTH:  1966-05-15  DATE OF ADMISSION:  12/28/2012  PRIMARY CARE PHYSICIAN: Dr. Gavin Potters.   CHIEF COMPLAINT: Left lower quadrant abdominal pain and bright-red blood per rectum.   HISTORY OF PRESENTING ILLNESS: This is a 49 year old Caucasian female patient with a history of coronary artery disease with cardiac catheterization and stent recently in 11/2012 with GERD, past diverticulitis and hypertension, presented to the Emergency Room today complaining of 4 episodes of bright-red blood per rectum and left lower quadrant pain. The patient had this blood mixed with stools, left lower quadrant pain and nausea. No vomiting. No melena. No further bleeding in the ER and a rectal exam done by the ER physician has not shown any blood or stool and the rectal vault was empty. No hemorrhoids found.   The patient had a colonoscopy one year prior with Dr. Niel Hummer for followup of colonic polyps and it was normal as per the patient. She did have diverticulitis 3 years prior for which she had a colonoscopy.   The patient is taking aspirin and Plavix. She is unaware what kind of stent was placed at [facility] for her coronary artery disease in 11/2012.   Her hemoglobin today in the Emergency Room was 15 initially; a repeat hemoglobin is 13.   PAST MEDICAL HISTORY:  1. Coronary artery disease with a recent stent placement in 11/2012 at [facility].  2. GERD.  3. Fibromyalgia.  4. Depression.  5. Hypertension.  6. Kidney stones.  7. Abdominal aortic aneurysm.  8. Hiatal hernia.  9. Hyperlipidemia.   ALLERGIES: AMOXICILLIN, FLEXERIL, LEVAQUIN, NEURONTIN, RANEXA,  SULFA, SOTALOL, LATEX TAPE.   PAST SURGICAL HISTORY: A partial hysterectomy, tubal ligation, ankle surgery, back surgery for spinal stimulator replacement and cardiac catheterization.   SOCIAL HISTORY: The patient lives with her husband, is on disability due to her back and her  heart. Is a former smoker of 25 years, quit in 2008, does not drink alcohol.   CODE STATUS: FULL CODE.   FAMILY HISTORY: Positive for lupus in her sister, coronary artery disease in some siblings. Father and her grandfather died from MIs. Positive breast cancer in her mother.   REVIEW OF SYSTEMS:  CONSTITUTIONAL: Complains of some fatigue, lightheadedness and weakness. No pain or weight loss.  EYES: No blurred vision, pain or redness.  ENT: No tinnitus, ear pain, hearing loss.  RESPIRATORY: No cough, wheeze, hemoptysis or dyspnea.  CARDIOVASCULAR: No chest pain, orthopnea, edema or arrhythmias.  GASTROINTESTINAL: Had nausea. No vomiting, diarrhea, has left lower quadrant abdominal pain and bright-red blood per rectum.  GENITOURINARY: No dysuria, hematuria or frequency. Has kidney stones.  ENDOCRINE: No polyuria, nocturia or thyroid problems.  HEMATOLOGIC AND LYMPHATIC: No anemia, easy bruising, bleeding.  INTEGUMENTARY: No acne, rash, lesions.  MUSCULOSKELETAL: Has chronic back pain and arthritis.  NEUROLOGIC: No numbness, weakness, dysarthria or seizures.  PSYCHIATRIC: No anxiety or depression.   HOME MEDICATIONS INCLUDE:  1. Aspirin 81 mg oral once a day.  2. Plavix 75 mg oral once a day.  3. Carbidopa/levodopa 50/200 1 tablet oral 2 times a day. 4. Famotidine 40 mg oral 2 times a day.  5. Lipitor 80 mg oral once a day.  6. Metoprolol succinate 25 mg oral once a day.  7. Nitroglycerin 0.4 as needed for chest pain.  8. Omeprazole 20 mg oral 2 times a day.  9. Proventil HFA 2 puffs inhaled 4 times a day.  10.  Vitamin D2 50,000 international units oral 2 times a week.   PHYSICAL EXAMINATION:  VITAL SIGNS: Temperature 99, pulse of 97, respirations 18, blood pressure 110/56, saturating 98% on room air. GENERAL: Moderately built Caucasian female patient lying in bed, comfortable, conversational, cooperative with exam.  PSYCHIATRIC: Alert, oriented x 3. Mood and affect appropriate.  Judgment intact.  HEENT: Atraumatic, normocephalic. Oral mucosa moist and pink. External ears and nose normal. No pallor. No icterus. Pupils bilaterally equal and reactive to have light.  NECK: Supple. No thyromegaly. No palpable lymph nodes. Trachea midline. No carotid bruit or JVD.  CARDIOVASCULAR: S1, S2, regular rate and rhythm without any murmurs. Peripheral pulses 2+.  RESPIRATORY: Normal work of breathing. Clear to auscultation on both sides.  GASTROINTESTINAL: Soft abdomen. Tenderness in the left lower quadrant without any rigidity or guarding. Bowel sounds present. No hepatosplenomegaly palpable. RECTAL EXAM: Deferred as done by ER physician.  SKIN: Warm and dry. No petechiae, rash, ulcers.  MUSCULOSKELETAL: No joint swelling, redness, effusion of the large joints. Normal muscle tone.  NEUROLOGICAL: Motor strength 5/5 in upper extremities. Sensation to fine touch intact all over.  LYMPHATIC: No cervical lymphadenopathy.   LABORATORY, DIAGNOSTIC, AND RADIOLOGICAL DATA:  Show glucose of 104, BUN 6, creatinine 0.58. AST, ALT, alkaline phosphatase, bilirubin normal. WBC 10.7, hemoglobin 15.6 and dropped to 13 in 4 hours in the Emergency Room. Platelets are 290. Urinalysis shows a trace bacteria, 4 WBC.   ASSESSMENT AND PLAN:  1. Bright-red blood per rectum with left lower quadrant pain. A CT scan of the abdomen has not been done. It raises concern for either diverticulitis versus ischemic colitis. The polyps could be a cause but pain in the abdomen point other way. We will get a CT scan of the abdomen with contrast. We will check her CBC q.6 hours at this time, hold the aspirin and Plavix. The patient recently had a cardiac catheterization, unknown stent but considering she is bleeding acutely at this time, the Plavix needs to be held. We will await Gastroenterology input regarding continuing the Plavix. The patient's hemoglobin did drop from 15 to 13, at this time needs close monitoring. Will  need inpatient admission, will also need likely colonoscopy. High risk for further bleeding.  2. Coronary artery disease. Continue the beta blocker. Statin, aspirin and Plavix being held secondary to GI bleed. No chest pain at this time.  3. Hypertension. Continue medications.  4. Deep vein thrombosis prophylaxis. SCDs and TEDS.   CODE STATUS: FULL CODE.   TIME SPENT TODAY ON THIS CASE: 40 minutes.   ____________________________ Molinda Bailiff Chelbi Herber, MD srs:jm D: 12/28/2012 13:58:28 ET T: 12/28/2012 15:50:01 ET JOB#: [id number]  cc: Wardell Heath R. Elpidio Anis, MD, <Dictator> Lurline Del, MD Letitia Caul, MD Orie Fisherman MD ELECTRONICALLY SIGNED 12/28/2012 18:06

## 2015-03-05 NOTE — Discharge Summary (Signed)
PATIENT NAME:  Kristin Coleman, Kristin Coleman MR#:  [id number] DATE OF BIRTH:  1966-06-27  DATE OF ADMISSION:  12/28/2012 DATE OF DISCHARGE:  12/28/2012  PRIMARY CARE PHYSICIAN: Rolm Gala, MD.   DISCHARGE DIAGNOSES:  1.  Bright red blood per rectum.  2.  Hypertension.   Please refer to previously dictated H and P.   The patient was admitted onto medical floor for further work-up and treatment of her GI bleed. CT scan of the abdomen has not shown any colitis or problems with eccentric ischemia. The patient was n.p.o. for a colonoscopy in the morning. The patient was extremely agitated being n.p.o. and threatened to leave AMA. I discussed extensively with the patient to stay in the hospital as she is on aspirin and Plavix and is at high risk for further bleeding as the etiology is still unknown and she needs a colonoscopy, but patient refused this and left AMA. At the time of discharge, I have advised the patient to return to the Emergency Room if she has any further bleeding and not take her aspirin and Plavix and see her primary care physician at the earliest available appointment in 1 to 2 days.   Time spent on discharge activity including counseling of the patient at the time of discharge was 35 minutes.    ____________________________ Molinda Bailiff Dannel Rafter, MD srs:aw D: 12/29/2012 17:17:45 ET T: 12/30/2012 11:00:23 ET JOB#: [id number]  cc: Wardell Heath R. Siegfried Vieth, MD, <Dictator> Orie Fisherman MD ELECTRONICALLY SIGNED 01/01/2013 5:15

## 2015-03-05 NOTE — H&P (Signed)
PATIENT NAME:  Kristin Coleman, Kristin Coleman MR#:  [id number] DATE OF BIRTH:  17-Jan-1966  DATE OF ADMISSION:  02/06/2013  REFERRING PHYSICIAN: Dr. Bayard Males.   PRIMARY CARE PHYSICIAN: Dr. Rolm Gala at [facility].    PRIMARY CARDIOLOGIST: Dr. Okey Dupre at [facility].   CHIEF COMPLAINT: Chest pain.   HISTORY OF PRESENT ILLNESS: This is a 49 year old female with significant past medical history of coronary artery disease status post multiple stents, with most recent stent in January 2014 at [facility], as well as known history of gastroesophageal reflux disease, fibromyalgia, hypertension, hyperlipidemia, who presents with complaint of chest pain. The patient as well was at [facility] on February 27th complaining of chest pressure with negative nuclear medicine SPECT scan as per [facility] documentation. The patient presents today complaining of chest pain. Woke her up from sleep. Developed at rest. Described it as sharp, substernal in the left side, radiating to the jaw and left arm. She took multiple nitroglycerin x4 at home without much improvement; as well took 3 by EMS and in ED with minimal improvement of her chest pain. As well, the patient was noticed to have a new T wave inversion in the inferolateral leads, where she was given 324 of aspirin and started on heparin drip out of concern of acute coronary syndrome. As well, the patient reports her chest pain was accompanied by nausea, sweating, shortness of breath. Denies any cough, any productive sputum, any palpitations or dizziness. The patient reports this pain does not resemble the previous pain she had in January and February, as she described this more as actual pain and she described the previous episodes as pressure. Currently, the patient still complains of chest pain but reports it is much improved. Her blood pressure was on the lower side, so she could not be started on a nitroglycerin drip. Hospitalist service was requested to admit the patient for further  management and workup of her acute coronary syndrome. The patient reports she has been compliant with her medication.   PAST MEDICAL HISTORY:  1. Coronary artery disease with recent stent placement in January 2014 at [facility].  2. Gastroesophageal reflux disease.   3. Fibromyalgia.  4. Depression.  5. Hypertension.  6. Kidney stones.  7. Abdominal aortic aneurysm.  8. Hiatal hernia.  9. Hyperlipidemia.   ALLERGIES:  1. AMOXICILLIN.  2. FLEXERIL.  3. LEVAQUIN.  4. NEURONTIN.  5. RANEXA.  6. SULFA.  7. SOTALOL.  8. LATEX TAPE.   PAST SURGICAL HISTORY:  1. Partial hysterectomy.  2. Tubal ligation.  3. Ankle surgery.  4. Back surgery for spinal stimulator replacement.  5. Cardiac catheterization.   SOCIAL HISTORY: The patient lives at home. She is on disability due to her back and her heart issues. She is a former smoker of 25 years. Quit smoking in 2008. Does not drink alcohol.   FAMILY HISTORY: Significant for lupus in a sister, coronary artery disease in her siblings.   HOME MEDICATIONS:  1. Aspirin 81 mg oral daily.  2. Enalapril 10 mg oral 2 times a day.  3. Sublingual nitroglycerin as needed for pain.  4. Lipitor 80 mg oral at bedtime.  5. Requip 5 mg oral at bedtime for restless leg syndrome.  6. Plavix 75 mg oral daily.  7. Metoprolol succinate 25 mg extended release oral daily.  8. Albuterol as needed.  9. Norvasc 5 mg oral daily.  10. Famotidine 40 mg oral 2 times a day.  11. Omeprazole 20 mg oral 2  times a day.   REVIEW OF SYSTEMS:  CONSTITUTIONAL: The patient denies any fever, chills, fatigue, weakness, weight loss, weight gain.  EYES: Denies blurry vision, double vision, pain, inflammation or glaucoma.  ENT: Denies tinnitus, ear pain, hearing loss, epistaxis or snoring.  RESPIRATORY: Denies cough, wheezing, hemoptysis, COPD. Has complaints of dyspnea.  CARDIOVASCULAR: Complains of chest pain. Denies edema, arrhythmia, palpitations or syncope.   GASTROINTESTINAL: Complains of nausea and vomiting. Denies diarrhea, abdominal pain, hematemesis, melena, rectal bleed, hemorrhoids or jaundice.  GENITOURINARY: Denies dysuria, hematuria, or renal colic.  ENDOCRINE: Denies polyuria, polydipsia, heat or cold intolerance.  HEMATOLOGIC: Denies any easy bruising, bleeding diathesis.  INTEGUMENT: Denies acne, rash or skin lesions.  MUSCULOSKELETAL: Complains of chronic lower back pain. Denies gout, arthritis or cramps.  NEUROLOGICAL: Denies weakness, dysarthria, epilepsy, tremors, vertigo, ataxia, seizures or memory loss.  PSYCHIATRIC: Denies anxiety, insomnia, schizophrenia, substance or alcohol abuse.   PHYSICAL EXAMINATION:  VITAL SIGNS: Temperature 97.8, pulse 76, respiratory rate 20, blood pressure 97/61, saturating 97% on room air.  GENERAL: Well-nourished female who looks comfortable in bed, in no apparent distress.  HEENT: Head atraumatic, normocephalic. Pupils equal and reactive to light. Pink conjunctivae. Anicteric sclerae. Moist oral mucosa.  NECK: Supple. No thyromegaly. No JVD.  CHEST: Good air entry bilaterally. No wheezing, rales, rhonchi.  CARDIOVASCULAR: S1, S2 heard. No rubs, murmurs or gallops.   ABDOMEN: Soft, nontender, nondistended. Bowel sounds present.  EXTREMITIES: No edema. No clubbing. No cyanosis. Dorsalis pedis pulses +2.  PSYCHIATRIC: Appropriate affect. Awake, alert. oriented x3. Intact judgment and insight. NEUROLOGIC: Cranial nerves grossly intact. Motor 5 out of 5. No focal deficits.  SKIN: Normal skin turgor. Warm and dry.   PERTINENT LABS: Glucose 110, BUN 7, creatinine 0.55, sodium 142 potassium 2.7, chloride 110, CO2 25, anion gap 7. Troponin less than 0.02. White blood cells 17.7, hemoglobin 13.7, hematocrit 41.7, platelets 342. INR 0.9. EKG showing new T wave inversion in inferolateral leads with sinus tachycardia at 106 beats per minute.   ASSESSMENT AND PLAN:  1. Chest pain: Given this patient's  significant cardiac history with chest pain not relieved by sublingual nitroglycerin, there was a concern about acute coronary syndrome/unstable angina, so she was given 324 of aspirin in the ED and started on intravenous heparin drip. Will admit the patient to telemetry floor. Will continue her on her home medications, aspirin, Plavix, statin, enalapril, metoprolol and sublingual nitroglycerin. Will repeat EKG in a.m., especially as she had some EKG changes in the ED, and will consult cardiology service. Continue to cycle her troponins to follow the trend. Medical records were obtained from [facility]. There is no official reading of the nuclear medicine stress test done at [facility], but there is mention in the discharge summary that it was low risk without any significant abnormalities.  2. Hypokalemia: Will replace. Will recheck level in a.m.  3. Leukocytosis: The patient is febrile, does not have any complaints of cough for her symptoms, but will check urinalysis.  4. Hyperlipidemia: Continue with statin.  5. Hypertension: Blood pressure is on the lower side so will hold Norvasc. Will continue with metoprolol and enalapril.  6. Gastroesophageal reflux disease: Continue with famotidine.  7. Code status: The patient is FULL CODE.  8. Deep vein thrombosis prophylaxis. The patient is on full dose anticoagulation due to her acute coronary syndrome (ACS).   TOTAL TIME SPENT ON ADMISSION AND PATIENT CARE: 55 minutes.    ____________________________ Starleen Arms, MD dse:gb D: 02/06/2013 02:47:25 ET T:  02/06/2013 04:05:30 ET JOB#: [id number]  cc: Starleen Arms, MD, <Dictator> Khrystina Bonnes Teena Irani MD ELECTRONICALLY SIGNED 02/07/2013 7:15

## 2015-03-05 NOTE — Discharge Summary (Signed)
PATIENT NAME:  Kristin Coleman, Kristin Coleman MR#:  [id number] DATE OF BIRTH:  Aug 14, 1966  DATE OF ADMISSION:  07/01/2013 DATE OF DISCHARGE:  07/01/2013  ADMITTING PHYSICIAN:  Dr. Gladstone Lighter.    Note, the patient left AGAINST MEDICAL ADVICE on 07/01/2013.   DIAGNOSES  1.  Chest pain, likely secondary to gastritis.  2.  Narcotic-seeking behavior.  3.  Fibromyalgia.  4.  Hypertension.  5.  Hyperlipidemia.  6.  Adrenal insufficiency.  7.  History of abdominal aortic aneurysm.    8.  Chronic back pain, status post back surgery, following with the pain clinic and has a pain contract.  9.  Hiatal hernia.    LABS AND IMAGING STUDIES 1.  While in the hospital urinalysis negative for any infection.  2.  WBC 15.0, hemoglobin 12.4, hematocrit 37.9, platelet count 329.  3.  Sodium 140, potassium 3.6, chloride 108, bicarbonate 25, BUN 9, creatinine 0.68, glucose 82 and calcium of 9.5.  4.  ALT 17, AST 12, alk phos 91, total bili 0.3, albumin of 3.6.  5.  Troponins x 2 remained negative.  6.  Chest x-ray did not show any acute cardiopulmonary disease.  7.  Echo Doppler was done and showed a normal EF of 60% to 65%. No wall motion abnormalities, mild mitral valve regurgitation and mild aortic regurgitation noted.   BRIEF HOSPITAL COURSE: Kristin Coleman is a 49 year old Caucasian female with past medical history significant for coronary artery disease, status post stents in the past, the most recent ones being placed at [facility] in January 2014, hypertension, fibromyalgia, chronic back pain, who follows with pain management clinic, had multiple admissions in the past for chest pain and there is a questionable narcotic-seeking behavior. The patient was brought to the ER secondary to sudden onset of chest pain that woke her in sleep and she has received several doses of IV morphine in the ER and was placed on nitro drip and is being admitted.   Chest pain appears more gastritic in nature versus narcotic-seeking  behavior. Her pain was not improved on nitro drip. She was changed over to nitro paste and all her cardiac medications were being continued. Her troponins were negative x 2. Echo Doppler came back completely normal. The patient was admitted to telemetry floor for observation and strict orders were written not to give any IV narcotics; however, the patient was still complaining of pain, requesting IV narcotic pain medications and it because they were not given the patient has left the floor [place] and she refused to even signed the [place] papers. Her pain management physician was contacted on admission and has strongly recommended not to be given prescription on narcotics at the time of discharge and no extended narcotics were recommended while  in the hospital.   ADDITIONAL TIME SPENT: 35 minutes.   ____________________________ Gladstone Lighter, MD rk:cs D: 07/02/2013 14:42:43 ET T: 07/02/2013 19:33:26 ET JOB#: [id number]  cc: Gladstone Lighter, MD, <Dictator> Gladstone Lighter MD ELECTRONICALLY SIGNED 07/23/2013 16:07

## 2015-03-05 NOTE — Consult Note (Signed)
PATIENT NAME:  Kristin Coleman, Kristin Coleman MR#:  [id number] DATE OF BIRTH:  05-Nov-1966  GASTROENTEROLOGY CONSULTATION REPORT  Dictated for Christena Deem, MD   DATE OF CONSULTATION:  04/09/2013  REFERRING PHYSICIAN:  Dr. Rudene Re.  CONSULTING PHYSICIAN:  Hardie Shackleton. Siaosi Alter, PA-C  REASON FOR CONSULTATION: Intractable epigastric abdominal pain.   HISTORY OF PRESENT ILLNESS: This is a pleasant 49 year old female who initially presented to the Emergency Room with concerns of worsening epigastric and right upper quadrant abdominal pain for the past 2 weeks. She states that this has been getting progressively worse as the day goes on. She has been taking a PPI prior to the history of a hiatal hernia and recurrent H. pylori infections. Prior to admission, however, she does admit that she has been taking a PPI as well as Carafate, but has not noticed any relief with this. There is accompanying daily nausea, but there is no vomiting. Over the past 48 hours there have been looser stools, but outside of this she denies any diarrhea, constipation, bright red blood per rectum or melena. She has been afebrile. She denies any recent travel or sick contacts. She denies NSAID or alcohol use. Lipase and LFTs are within normal limits, and an ultrasound of the abdomen obtained in the ER was notable for possible minimal gallbladder sludge, granulomatous calcifications in the spleen, but no evidence of cholelithiasis or cholecystitis. She does have a history of H. pylori  approximately 3 to 4 times in the past, but had been diagnosed through serum and stool samples. She has never had an EGD. Since being admitted she has been started on an IV PPI 40 mg b.i.d. Of note, she did have a recent MI about 4 months ago where she had stents placed, and is currently on aspirin and Plavix. No dysphagia. No current chest pain or shortness of breath.   ALLERGIES: SULFA, RANEXA, AMOXICILLIN, TORADOL, FLEXERIL, NEURONTIN and LATEX.   PAST MEDICAL  HISTORY: Coronary artery disease, recent MI 4 months ago status post stent placement, dyslipidemia, hypertension, abdominal aortic aneurysm, nephrolithiasis, depression, fibromyalgia, GERD, and hiatal hernia. Also history of recurrent H. pylori.   PAST SURGICAL HISTORY: Tubal ligation, partial hysterectomy, back surgery, ankle surgery, and cardiac cath with cardiac stent placement.   SOCIAL HISTORY: The patient denies any alcohol, tobacco or illicit drug use.   FAMILY HISTORY: No known family history of GI malignancy or IBD.   REVIEW OF SYSTEMS: Ten systems review of systems was obtained on the patient. The patient does complain of nausea, epigastric and right upper quadrant abdominal pain. Recent MI 4 four months ago. All other pertinent positives are mentioned above and are otherwise negative.   OBJECTIVE: VITAL SIGNS: Blood pressure 112/71, heart rate 80, respirations 18, temp  98.2, bedside pulse ox 96%.  GENERAL: This is a pleasant 49 year old female resting quietly and comfortably in bed, in no acute distress. Alert and oriented x 3.  HEAD: Atraumatic, normocephalic.  NECK: Supple. No lymphadenopathy noted.  HEENT: Sclerae anicteric. Mucous membranes moist.  LUNGS: Respirations are even and unlabored.  CARDIAC: Regular rate and rhythm.  ABDOMEN: Soft. Mildly tender in the epigastric and right upper quadrant region. Negative Murphy's. Normoactive bowel sounds in all 4 quadrants. No masses, hernias or organomegaly appreciated.  EXTREMITIES: Negative for lower extremity edema; 2+ pulses noted bilaterally.  PSYCHIATRIC: Appropriate mood and affect.   LABORATORY DATA: White blood cells 9.9, hemoglobin 13.9, hematocrit 41.1, platelets 330, MCV 85.   Sodium 138, potassium 3.4, BUN 3, creatinine  0.73, glucose 93. Bilirubin 0.3, alkaline phosphatase 95, ALT 18, AST 14, albumin 4.3, lipase 127. Urinalysis negative. Troponins negative.   IMAGING: Ultrasound of the abdomen was done on the  patient. There was minimal gallbladder sludge. No evidence of cholelithiasis or cholecystitis. Abdominal aortic aneurysm noted 2.7 cm at its widest diameter. Also granulomatous calcifications in the spleen were noted.   ASSESSMENT: 1.  Epigastric/right upper quadrant abdominal pain x 2 weeks, but progressively worsening each day.  2.  Nausea.  3.  Coronary artery disease, with recent myocardial infarction 4 months ago, status post stent placement. The patient is currently on Plavix and aspirin.  4. History of recurrent Helicobacter pylori. Approximately 3 to 4 prior episodes in her life, status post treatment.   PLAN: I have discussed the patient's case in detail with Dr. Barnetta Chapel, who is involved in the development of the patient's plan of care. At this time we did review the ultrasound as well as her lab work, and we do agree with her being on a PPI 40 mg twice daily IV. We do agree with the patient undergoing a HIDA scan, and we will await these findings to make our further recommendations. She can likely benefit from an EGD to rule out recurrent H. pylori versus  peptic ulcer disease, versus gastritis. However due to her recent MI requiring Plavix and aspirin therapy currently, we will consider an upper GI series versus an EGD pending the findings of the HIDA scan. In the meantime, continue PPI and antiemetics as needed. We will continue to follow this patient throughout hospitalization and make further recommendations pending above and per clinical course. All questions were answered.   Thank you so much for this consultation and for allowing [place] to participate in the patient's plan of care.   Attending gastroenterologist is Dr. Barnetta Chapel.     ____________________________ Hardie Shackleton. Zyonna Vardaman, PA-C kme:dm D: 04/09/2013 14:52:30 ET T: 04/09/2013 15:30:15 ET JOB#: [id number]  cc: Hardie Shackleton. Devonta Blanford, PA-C, <Dictator> Hardie Shackleton Arista Kettlewell PA ELECTRONICALLY SIGNED 04/11/2013 9:09

## 2015-03-05 NOTE — Consult Note (Signed)
Chief Complaint:  Subjective/Chief Complaint Patietn seen and examined, please see full GI consult.  Patietn admitted with marked epigastric and ruq pain.   Labs unremarkable.  Patietn did not tolerate HIDA today due to problems with claustrophobia.. History of recent MI and multiple cardiac stents on placix and asa.  H/O being treated for h. pylori 3 time in the past 2-3 years, but only one egd several years ago for dysphagia-no a problem now.   On physical exam there is marked tenderness in the epigastrum to right of midline and the medial ruq.  Recommend another try at the hida, patietn states she thinks she can do it if given anxiolytic.  Will need to be off narcots for at least 4 hours prior to the test.  History of cardiac stents and placix precludes initial evaluation of stomach to be endoscopic, but woudl recommend UIGS once HIDA is complete.  Continue bid ppi and carafate.  Will order stool for h/\. pylori.  A small percentage of h. pylori infections can be recalcitrant, and very difficult to eradicate despite multiple courses of treatment.  Following.   VITAL SIGNS/ANCILLARY NOTES: **Vital Signs.:   28-May-14 13:34  Vital Signs Type Routine  Temperature Temperature (F) 98.2  Celsius 36.7  Temperature Source oral  Pulse Pulse 80  Respirations Respirations 18  Systolic BP Systolic BP 112  Diastolic BP (mmHg) Diastolic BP (mmHg) 71  Mean BP 84  Pulse Ox % Pulse Ox % 96  Pulse Ox Activity Level  At rest  Oxygen Delivery Room Air/ 21 %   Electronic Signatures: Barnetta Chapel (MD)  (Signed 28-May-14 21:29)  Authored: Chief Complaint, VITAL SIGNS/ANCILLARY NOTES   Last Updated: 28-May-14 21:29 by Barnetta Chapel (MD)

## 2015-03-05 NOTE — H&P (Signed)
PATIENT NAME:  Kristin Coleman, PRESTWICH MR#:  [id number] DATE OF BIRTH:  09/15/1966  DATE OF ADMISSION:  11/22/2012  REFERRING PHYSICIAN: Dr. Si Raider.  CARDIOLOGIST: Dr. Juliann Pares and Dr. Okey Dupre at [facility].   PRIMARY CARE PHYSICIAN: Dr. Gavin Potters.  HISTORY OF PRESENT ILLNESS: The patient is a 49 year old female who has history of chronic medical conditions including hypertension, coronary artery disease with 100% RCA obstruction with mid collaterals left-to-right. She has had an MI in the past and has had stent placement. She also has history of chronic abdominal pain, chronic diarrhea, chronic low back pain with a spinal stimulator, depression, fibromyalgia. She comes today with a history of chest pain that happened this afternoon. She has been having a headache all week due to her elevated blood pressure, despite the fact that she assured me that she has been taking her medications. The chest pain started while she was resting. She took 3 nitroglycerin sprays and the pain went away. Since she was having the headache, she went to take shower. When she out of the shower, she had chest pain again, took 3 nitroglycerins and the pain went away, and she decided to come to the ER. Her pain at this moment is actually worse than it was earlier. It is 8 out of 10, left side, no radiation. Positive mild nausea, no vomiting, no sweating. The patient has mostly complaints of the pain. She is not complaining of any other muscle aches, although she says that occasionally she hurts all over.   REVIEW OF SYSTEMS: CONSTITUTIONAL: No fever. No fatigue. No weakness.  EYES: No blurry vision or double vision.  ENT: No tinnitus. No difficulty swallowing. No postnasal drip.  RESPIRATORY: No cough. No wheezing. No hemoptysis.  CARDIOVASCULAR: Positive chest pain. No orthopnea. She states that she occasionally has edema to the lower extremities when she ambulates. No syncope. No palpitations.   GASTROINTESTINAL: Positive nausea. No vomiting. Positive on-and-off diarrhea. Positive chronic abdominal pain, at this moment is not hurting.  GENITOURINARY: No dysuria or hematuria. No increased frequency.  GYNECOLOGIC: No breast masses.  ENDOCRINE: No polyuria, polydipsia or polyphagia. No cold or heat intolerance.  HEMATOLOGIC, LYMPHATIC: No easy bruising. No bleeding.  MUSCULOSKELETAL: No swollen joints at this moment, but complains of muscle aches and joint aches all over. At this moment, they are not hurting.  NEUROLOGIC: No numbness, tingling. No CVAs or TIAs in the past. PSYCHOLOGIC: No insomnia. No nervousness.   PAST MEDICAL HISTORY:  1.  MI, status post stent placement.  2.  A 100% right coronary artery occlusion with mid collaterals.  3.  Gastroesophageal reflux.  4.  Fibromyalgia.  5.  Depression.  6.  Hypertension.  7.  Kidney stones.  8.  Abdominal aortic aneurysm.  9.  Hiatal hernia.  10.  Hyperlipidemia.   ALLERGIES: TO MULTIPLE MEDICATIONS INCLUDING:  1.  AMOXICILLIN.  2.  FLEXERIL.  3.  LEVAQUIN.  4.  NEURONTIN.  5.  RANEXA.  6.  SULFAS. 7.  SOTALOL. 8.  LATEX. 9.  TAPE.  PAST SURGICAL HISTORY:  1.  Partial hysterectomy.  2.  Tubal ligation.  3.  Ankle surgery.  4.  Back surgery for spinal stimulator replacement.   SOCIAL HISTORY: The patient lives with husband. She is on disability due to her back and her heart. She is a former smoker of 1 pack a day for over 25 years. She quit in 2008. She does not drink. She does not use drugs.   FAMILY  HISTORY: Positive for lupus in her sister, coronary artery disease in some siblings. Her father and her grandfather died from MIs. Positive breast cancer in her mother.   PHYSICAL EXAMINATION:  VITAL SIGNS: Blood pressure 141/91. On admission, blood pressure 211/108, relief after nitroglycerin. Heart rate of 68, respiratory rate 18, temperature 98.3. On admission, the patient had a heart rate of 90 and respirations of  20. Her oxygen saturation has been around 98% on room air and now she is on oxygen.  GENERAL: The patient is alert, oriented x 3. No acute distress. No respiratory distress. Comfortable in bed. HEENT: Pupils are equal and reactive. Extraocular movements are intact. Mucosa are moist. Anicteric sclerae. Pink conjunctivae.  NECK: Supple. No JVD. No thyromegaly. No adenopathy. No carotid bruits. Normal range of motion, although is tender to mobilization due to her fibromyalgia. Tenderness to palpation to the right scalene muscle, which feels really tight, and also following through on the chest muscles, there is some tenderness to palpation over the anterior chest wall.  CARDIOVASCULAR: Regular rate and rhythm. No murmurs, rubs or gallops. No displacement of PMI.  LUNGS: Clear without any wheezing or crepitus. No use of accessory muscles. No dullness to percussion.  ABDOMEN: Soft, nontender, nondistended. No hepatosplenomegaly. No masses. Bowel sounds are positive.  GENITALIA: Deferred.  EXTREMITIES: No edema, no cyanosis, no clubbing at this moment. Pulses +2. Capillary refill less than 3 seconds.  SKIN: Without any significant or visible petechiae or rashes.  LYMPHATIC: Negative for lymphadenopathy in neck or supraclavicular areas.  MUSCULOSKELETAL: No significant bone or joint abnormality. No joint effusions.  PSYCHIATRIC: Mood: Flat affect.  NEUROLOGIC: Cranial nerves II through XII intact. No focal abnormalities.   LABORATORY, DIAGNOSTIC AND RADIOLOGICAL DATA: Electrolytes within normal limits. Creatinine 0.67, sodium 139, potassium 3.9. LFTs within normal limits. Troponin is 0.02, CK-MB less than 0.5. White cells 10.6, hemoglobin 14, platelets 230. Chest x-ray: No acute cardiopulmonary disease. EKG: No signs of ST depression or elevation, Q waves in septal leads.   ASSESSMENT AND PLAN: A 49 year old female with history of multiple medical problems including history of myocardial infarction, right  coronary artery occluded 100% proximally with collaterals, fibromyalgia, depression, hypertension, nephrolithiasis, hiatal hernia and an abdominal aortic aneurysm that was last measured at 2.7 cm.  1.  Chest pain. The patient comes with chest pain that is reproducible at this moment and likely has to do a lot with her fibromyalgia. I do not see any significant distress at this moment, although the patient keeps asking for pain medication. She is now on nitroglycerin paste and it seems to work okay. If the patient persists in having pain, we are going to put her on a nitroglycerin drip. My concern about doing this is that I am going to give her a big headache, and it seems like the patient has a lot of affinity to pain medications and she will be requiring more. I do not want to oversedate her with pain medications and mask some important cardiovascular symptoms or findings. Overall, we are going to rule her out medically. The patient is already known to have significant coronary artery disease. Although her left anterior descending was clean/normal left anterior descending, normal left main and circumflex, her right coronary artery is completely blocked but apparently is not a predominant vessel. The patient is going to be given nitroglycerin and aspirin, continue beta blockers, and will obtain a cardiology consultation. As far as her other medical problems: 2.  Significant malignant hypertension, which has  resolved with nitroglycerin already. Continue to watch her blood pressure. Continue to keep taps on her heart rate, try to maintain it closer to 60, and treat her blood pressure with as-needed medications if needed.  3.  Headache. Continue treatment with Dilaudid and Tylenol.  4.  Fibromyalgia, depression, nephrolithiasis, hiatal hernia and abdominal aortic aneurysm are stable for this moment.   CODE STATUS: The patient is a full code.   TIME SPENT: I spent about 45 minutes talking with this patient,  reviewing her records and discussing the case with the ER physician.    ____________________________ Felipa Furnace, MD rsg:jm D: 11/22/2012 20:17:03 ET T: 11/22/2012 21:01:31 ET JOB#: [id number]  cc: Felipa Furnace, MD, <Dictator> Geraldo Haris Juanda Chance MD ELECTRONICALLY SIGNED 12/06/2012 10:53

## 2015-03-05 NOTE — Consult Note (Signed)
Brief Consult Note: Diagnosis: epigastric abdominal pain, nausea.   Consult note dictated.   Discussed with Attending MD.   Comments: Patient seen and examined. Epigastric pain xseveral weeks but getting progressively worse. Pt has taken carafate and PPI at home without relief. +Nausea without vomiting. No fevers/chills. Denies NSAID use. 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 Never had an EGD. Some loose stools, no BRBPR/melena. Ultrasound and labs reviewed, GB sludge noted. Patient scheduled for HIDA this afternoon. She can likely benefit from EGD, however she is on Plavix due to very recent MI (within 4-5 months ago). We will make reccs pending HIDA findings, and will consider EGD vs UGI series to follow. Agree with IV PPI 40mg  BID. Antiemetics PRN. Will follow. Full consult being dictated..  Electronic Signatures: Ashok Cordia (PA-C)  (Signed 28-May-14 14:45)  Authored: Brief Consult Note   Last Updated: 28-May-14 14:45 by Ashok Cordia (PA-C)

## 2015-03-05 NOTE — Consult Note (Signed)
Brief Consult Note: Diagnosis: CP, atypical, neg troponin, neg ECG, known chronically occluded RCA with collaterals, intolerant of nitrates, allergic to Ranexa.   Patient was seen by consultant.   Consult note dictated.   Comments: REC  Agree with current therapy, add low dose CCB, increase activity, if patient does well consider dc, cont cardiac eval as out-patient.  Electronic Signatures: Marcina Millard (MD)  (Signed 11-Jan-14 10:40)  Authored: Brief Consult Note   Last Updated: 11-Jan-14 10:40 by Marcina Millard (MD)

## 2015-03-05 NOTE — Discharge Summary (Signed)
PATIENT NAME:  Kristin Coleman, Kristin Coleman MR#:  [id number] DATE OF BIRTH:  1966/06/13  DATE OF ADMISSION:  11/22/2012 DATE OF DISCHARGE:  11/24/2012  PRIMARY CARE PHYSICIAN: Rolm Gala, MD  FINAL DIAGNOSES:  1. Chest pain.  2. History of coronary artery disease.  3. Hypertension.  4. Fibromyalgia.  5. Chronic pain.  6. Gastroesophageal reflux disease.  7. Vitamin D deficiency.   MEDICATIONS ON DISCHARGE: Include aspirin 81 mg daily, Sinemet 50/200 one tablet twice a day, omeprazole 20 mg twice a day, Crestor 40 mg at bedtime, famotidine 40 mg twice a day, Proventil HFA 90 mcg inhalation two puffs every 4 hours, nitroglycerin p.r.n. chest pain 0.4 mg, oxycodone 7.5 mg every 6 hours, vitamin D2 50,000 units twice a week, vitamin D3 2000 units daily, acetaminophen/oxycodone 325/7.5 one tablet 3 times a day, metoprolol 25 mg daily, prednisone taper 5 mg, 4 tabs day one, 3 tabs day two, 2 tabs day three, 1 tab day four and five.   DIET: Low sodium diet, regular consistency.   ACTIVITY: As tolerated.   HOSPITAL COURSE: The patient was admitted as an observation for chest pain on 11/22/2012 discharged 11/24/2012. Came in with chest pain. She has a history of coronary artery disease with 100% RCA obstruction, mid-collaterals to the left and right. She was coming in with severe chest pain and was admitted as an observation. Cardiology consultation was obtained by Dr. Darrold Junker who recommended medical management. The patient did have a cardiac cath within the year and a stress test that was negative within the year. Her cardiac enzymes were negative x 3. A CT scan of the chest was done that showed no evidence of acute pulmonary embolism or aortic dissection. Laboratory and radiological data during the hospital course included cardiac enzymes that were negative x 3, chest x-ray no acute cardiopulmonary disease, urine toxicology positive for opiates, lipase negative, glucose 84, BUN 9, creatinine 0.67, sodium  139, potassium 3.9, chloride 107, CO2 of 27, calcium 9.6. Liver function tests normal. White blood cell count 10.6, H and H 14.6 and 45.9, platelet count of 230.   Hospital course per problem list:  1. For the patient's chest pain, cardiac enzymes were negative x 3. The patient was seen in consultation by Cardiology who recommended medical management, can follow up with her cardiologist as outpatient. There are people that do stenting in 100% blockages, but that would not be done at this facility. I do not believe that this is the source of the patient's pain. The patient still had chest pain on discharge, but it was better than what it was. A CT scan of the chest was negative for PE and aortic dissection. The patient has had numerous radiology studies during the hospitalizations in the past. The patient was given a trial of Solu-Medrol which seemed to help the pain a little bit. The patient was given a prednisone taper upon discharge. I think this pain may have been more musculoskeletal or chest wall syndrome.  2. Coronary artery disease. Medical management with aspirin, Toprol, Crestor. The patient has ALLERGIES TO IMDUR AND RANEXA, so this could not be given.  3. Hypertension was malignant upon admission, but then the patient was hypotensive and her enalapril was stopped and her Toprol dose was decreased.  4. Fibromyalgia.  5. Chronic pain.  6. Gastroesophageal reflux disease on omeprazole and famotidine.  7. Vitamin D deficiency on vitamin D replacement.   Time spent on discharge: 35 minutes.     ____________________________ Herschell Dimes.  Renae Gloss, MD rjw:es D: 11/24/2012 15:44:21 ET T: 11/25/2012 09:03:15 ET JOB#: [id number]  cc: Herschell Dimes. Renae Gloss, MD, <Dictator> Letitia Caul, MD Milas Gain. Okey Dupre, MD Salley Scarlet MD ELECTRONICALLY SIGNED 11/28/2012 20:03

## 2015-03-05 NOTE — Discharge Summary (Signed)
PATIENT NAME:  Kristin Coleman, Kristin Coleman MR#:  [id number] DATE OF BIRTH:  July 24, 1966  DATE OF ADMISSION:  05/31/2013 DATE OF DISCHARGE:  06/01/2013  DISCHARGE DIAGNOSS:  Chest pain, likely musculoskeletal versus gastroesophageal reflux disease.   CONSULTATIONS: Dr. Mariah Milling of cardiology.   IMAGING STUDIES DONE INCLUDE:  A CT scan of chest, abdomen and pelvis showed prior aortic aneurysm, also hiatal hernia, stable.  Chest x-ray showed no acute abnormalities.   ADMITTING HISTORY AND PHYSICAL AND HOSPITAL COURSE: Please see detailed H and P dictated by Dr. Allena Katz. In brief, a 49 year old female patient with prior history of coronary artery disease, status post stents, presented to the hospital complaining of chest pain. The patient has had recurrent admissions for the same problem over the last 6 months in multiple hospitals including [facility], [facility] and here at [facility]. Dr. Mariah Milling, of cardiology, saw the patient with normal EKG, normal cardiac enzymes. Her pain was thought to be muscle pain versus GERD. The patient has an endoscopy scheduled on 06/03/2013 with GI at [facility], and the patient will follow up. The patient was not given any pain medications as she goes to the pain clinic and should not be prescribed any pain medications other than at the pain clinic.   Prior to discharge, the patient did not have any chest pain. Cardiac examination was normal.   DISCHARGE MEDICATIONS: Include:  1.  Aspirin 81 mg oral once a day.  2.  Lipitor 80 mg oral once a day at bedtime.  3.  Plavix 75 mg oral once a day.  4.  Acetaminophen/oxycodone 325/7.5, 1 tablet oral 1 to 4 times a day as needed.  5.  Metoprolol succinate 50 mg oral once a day.  6.  Carafate 1 gram oral 4 times a day.  7.  Protonix 20 mg oral once a day.  8.  Proventil HFA 2 puffs inhaled every 4 hours as needed for shortness of breath.  9.  Nitroglycerin 0.4 mg sublingual every 5 minutes as needed for  chest pain.  10.  Requip 2 milligrams oral 3 times a day.   DISCHARGE INSTRUCTIONS:  1.  Low-sodium diet.  2.  Activity as tolerated.  3.  Follow up for her EGD at [facility] on 06/03/2013 and primary care physician in 1 to 2 weeks.   TIME SPENT: On day of discharge in discharge activity was 40 minutes.   ____________________________ Molinda Bailiff Kristin Alkhatib, MD srs:cb D: 06/02/2013 14:47:54 ET T: 06/02/2013 20:21:37 ET JOB#: [id number]  cc: Kristin Heath R. Marika Mahaffy, MD, <Dictator> [facility] Orie Fisherman MD ELECTRONICALLY SIGNED 06/03/2013 15:44

## 2015-03-05 NOTE — Consult Note (Signed)
PATIENT NAME:  Kristin, Coleman MR#:  [id number] DATE OF BIRTH:  1965-12-15  DATE OF CONSULTATION:  04/09/2013  REFERRING PHYSICIAN:   CONSULTING PHYSICIAN:  Cristal Deer A. Sharifah Champine, MD  REASON FOR CONSULTATION: Epigastric pain.   HISTORY OF PRESENT ILLNESS: Kristin Coleman is a pleasant 49 year old female with history of coronary artery disease with MI and multiple stents, hypertension and reflux disease, who comes in with 3 weeks of epigastric pain. She says that it is constant, worse with anything overall and  has gotten worse over the recent 24 hours. She has had this pain intermittently on and off for approximately 2 years. Usually it lasts 1 day and this is much longer associated with nausea, no vomiting. No fevers, chills, night sweats, shortness of breath, cough, chest pain, diarrhea or constipation. She did have, recently started by her primary doctor 2 weeks ago, Carafate and Protonix without improvement. She had an ultrasound the Emergency Room, which shows minimal amount of sludge without any cholelithiasis or cholecystitis.   PAST MEDICAL HISTORY:  1.  Coronary artery disease, status post 4 MIs, most recently in January of this year. History of 6 stents, most recently in January at [facility] and was told that it is bare metal. Her cardiologist is Dr. Okey Dupre.  2.  Hypertension.  3.  Hyperlipidemia. 4.  Abdominal aortic aneurysm.  5.  Gastroesophageal reflux disease.  6.  Hiatal hernia.  7.  Fibromyalgia. 8.  Depression.  9.  Kidney stones.  10. Status post partial hysterectomy.  11. History of back surgery.  12. History of tubal ligation.  13. History of ankle surgery.   FAMILY HISTORY: Dad and mother all with coronary artery disease and history of diabetes, history of lupus, history of breast cancer, history of Parkinson's disease.   SOCIAL HISTORY: Denies tobacco or alcohol use currently.   ADMISSION MEDICATIONS: 1.  Protonix.  2.  Carafate.  3.  Requip.  4.   Proventil.  5.  Plavix.  6.  Aspirin.  7.  Norvasc.  8.  Nitroglycerin.  9.  Metoprolol.  10. Lipitor. 11. Enalapril.  12. Percocet.   ALLERGIES: SULFA, RANEXA, FLEXERIL, TORADOL, AMOXICILLIN. NEURONTIN, LEVAQUIN AND LATEX.   REVIEW OF SYSTEMS: A 12-point review of systems was obtained. Pertinent positives and negatives as above.   PHYSICAL EXAMINATION:  VITAL SIGNS: Temperature 98.6, pulse 50, blood pressure 140/74, respirations 18, 90%.  GENERAL: No acute distress. Alert and oriented x 3.  HEAD: Normocephalic, atraumatic. Eyes: No scleral icterus. No conjunctivitis. There is no obvious facial trauma. Normal external nose. Normal external ears.  CHEST: Lungs clear to auscultation. Moving air well.  HEART: Regular rate and rhythm. No murmurs, rubs, or gallops.  ABDOMEN: Soft, tender to palpation in the epigastrium, nondistended.  EXTREMITIES: Moves all extremities well. Strength 5 out of  5.  NEUROLOGIC: Cranial nerves II through XII grossly intact. Sensation intact in all 4 extremities.   LABORATORY DATA: I reviewed all of Kristin Coleman's labs and they are relatively unremarkable. White cell count is 9.9.   Ultrasound shows minimal gallbladder sludge. No pericholecystic fluid. No gallbladder wall thickening.   ASSESSMENT AND PLAN: Kristin Coleman is a pleasant 49 year old female with epigastric pain and minimal findings on her ultrasound that is truly concerning for cholecystitis. Consider HIDA scan; however, I feel that her pain is atypical for gallbladder sludge and would also entertain peptic ulcer disease and therefore we will follow up on gastroenterology recommendations. If the patient is found to have cholecystitis due  to the fact she has had a recent myocardial infarction, would treat nonoperatively as surgery is at high risk being only 4 months out from her myocardial infarction and she has multiple stents, so therefore I have to manage Plavix. No acute surgical issues. Will follow with  you.  ____________________________ Si Raider. Trevia Nop, MD cal:aw D: 04/09/2013 07:17:29 ET T: 04/09/2013 08:03:48 ET JOB#: [id number]  cc: Cristal Deer A. Soul Hackman, MD, <Dictator> Jarvis Newcomer MD ELECTRONICALLY SIGNED 04/10/2013 20:38

## 2015-03-05 NOTE — Consult Note (Signed)
Chief Complaint:  Subjective/Chief Complaint seen for epigastric pain.  patient feeling about the same, mild n, no v.   VITAL SIGNS/ANCILLARY NOTES: **Vital Signs.:   29-May-14 05:05  Vital Signs Type Routine  Celsius 36.4  Temperature Source oral  Pulse Pulse 53  Respirations Respirations 18  Systolic BP Systolic BP 121  Diastolic BP (mmHg) Diastolic BP (mmHg) 74  Mean BP 89  Pulse Ox % Pulse Ox % 96  Pulse Ox Activity Level  At rest  Oxygen Delivery Room Air/ 21 %   Brief Assessment:  Cardiac Regular   Respiratory clear BS   Gastrointestinal details normal Soft  Nondistended  Bowel sounds normal  No rebound tenderness  No gaurding  tender to palpation medial upper ruq and right epigastrum, about the  same.   Assessment/Plan:  Assessment/Plan:  Assessment 1) abd pain-ruq/epigastric-some concern for possible cystic duct obstruction, awaiting repeat attempt for hida to rule out.  If negative will pursue UGIS. continue ppi and carafate.  Discussed with Dr Cherlynn Kaiser.  Following   Plan as above.   Electronic Signatures: Barnetta Chapel (MD)  (Signed 29-May-14 14:02)  Authored: Chief Complaint, VITAL SIGNS/ANCILLARY NOTES, Brief Assessment, Assessment/Plan   Last Updated: 29-May-14 14:02 by Barnetta Chapel (MD)

## 2015-03-05 NOTE — H&P (Signed)
PATIENT NAME:  Kristin Coleman, Kristin Coleman MR#:  [id number] DATE OF BIRTH:  1966/09/20  DATE OF ADMISSION:  05/31/2013  PRIMARY CARE PROVIDER: Dr. Sherral Hammers in [place], [place].   EMERGENCY DEPARTMENT REFERRING PHYSICIAN:  Dr. Mindi Junker.   CHIEF COMPLAINT: Chest pain.   HISTORY OF PRESENT ILLNESS: The patient is a 49 year old white female who has had multiple admissions to the hospital and has had multiple evaluations in the past. Reports that she has had a history 6 stents in the past.  Last hospitalized here on 06/23 with similar type of presentation and was subsequently discharged the next day, presents with pain in her left neck as well as left upper chest. She reports this is similar to her previous pains. She describes it as a sharp, constant type of pain. She reports that since last night she has taken multiple nitroglycerins without much relief. She also has associated nausea, but no real shortness of breath. She also was sweating since yesterday. She came to the ED and the ED physician spoke to Dr. Mariah Milling who recommended the patient be admitted and placed on full dose Lovenox. She, otherwise, denies any fevers, chills, no cough, no abdominal pain, no urinary symptoms.   PAST MEDICAL HISTORY: 1.  History of coronary artery disease with last catheter on March 27 showed significant two vessel coronary artery disease, RCA, has multiple stents, and known to be occluded in the mid segment with left and right collaterals, stent in the proximal left circumflex artery is patent.  2.  Hypertension.  3.  Hyperlipidemia.  4.  History of AAA.  5.  GERD.   6.  History of hiatal hernia.  7.  Fibromyalgia.  8.  Depression.  9.  Kidney stones.   PAST SURGICAL HISTORY: Status post hysterectomy. Status post back surgery. Status post tubal ligation. Status post ankle surgeries.    ALLERGIES:  AXOXICILLIN, FLEXERIL, LEVAQUIN, NEURONTIN, RANEXA, SULFA DRUGS, TORADOL, LATEX AND TAPE.   MEDICATIONS: The  patient reports that there are no changes in her medications. She is on acetaminophen/oxycodone 325/7.5 mg 1 tablet 4 times a day as needed, aspirin 81 mg 1 tab p.o. daily, Carafate 1 gram p.o. 4 times a day before meals and at bedtime, enalapril 10 b.i.d., Lipitor 80 at bedtime, Lopressor 50 at bedtime, nitroglycerin 0.4 sublingual p.r.n., Norvasc 5 daily,  Plavix 75 p.o. daily, Protonix 20 daily, Proventil 2 puffs q.4 p.r.n. for shortness of breath,  Requip 5 mg at bedtime.   REVIEW OF SYSTEMS:  CONSTITUTIONAL: Denies any fevers, chills. No headaches or dizziness. No weight loss, weight gain.  EYES: No double vision. No blurred vision. No cataracts. No glaucoma.  EAR, NOSE, THROAT: Denies any postnasal drips. No tinnitus. No ear pain. No hearing loss. No difficulty swallowing.   RESPIRATORY: Denies any cough, wheezing, hemoptysis. No dyspnea. No COPD, no TB.  CARDIOVASCULAR: Complains of chest pain, but no orthopnea. No edema. No arrhythmia. No syncope.  GASTROINTESTINAL: Complains of nausea, but no vomiting or diarrhea. No abdominal pain. No hematemesis. No melena. No ulcers.  GENITOURINARY: Denies any dysuria, hematuria, renal calculus. ENDOCRINE: Denies any polyuria, nocturia or thyroid problems.  HEMATOLOGIC/LYMPHATIC: Denies any easy bruisability or bleeding.  SKIN: No acne. No rash. No changes in mole, hair, skin.  MUSCULOSKELETAL: Denies any pain in the neck, back or shoulder.   NEUROLOGIC: Numbness. No CVA. No TIA.  PSYCHIATRIC: No anxiety. No insomnia. No ADD.   PHYSICAL EXAMINATION: VITAL SIGNS: Temperature 99.4, pulse 75, respirations 20, blood pressure 124/72,  O2 98%.  GENERAL: The patient is a thin Caucasian female, awake, currently not in any acute distress.  HEENT: Head atraumatic, normocephalic. Pupils equally round, reactive to light and accommodation. There is no conjunctival pallor. No scleral icterus. Nasal exam shows no drainage or ulceration.  OROPHARYNX: Clear without any  exudate. External ear exam shows no erythema.  NECK: Supple and symmetric. No masses.  LUNGS:  Respirations clear to auscultation bilaterally without any rales, rhonchi, or wheezing.  CARDIOVASCULAR: Regular rate and rhythm. No murmurs, rubs, clicks or gallops.  ABDOMEN: Soft, nontender, nondistended. Positive bowel sounds x 4.  GENITOURINARY: Deferred.  MUSCULOSKELETAL: There is no erythema or swelling.  SKIN: No rash.  LYMPHATICS: No lymph nodes palpable.  VASCULAR: Good DP, PT pulses.  NEUROLOGICAL: Cranial nerves II through XII grossly intact. Strength is symmetrical and equal.  PSYCHIATRIC: Not anxious or depressed.   EVALUATIONS:  EKG shows normal sinus rhythm with old anterior Q waves. No ST-T wave changes. BMP: Glucose 99, BUN 13, creatinine 0.95, sodium 140, potassium 3.7, chloride 107, CO2 is 23, calcium 10.1. LFTs are normal. CPK is 33, CK-MB 0.7. Troponin 0.04. WBC 21.4, hemoglobin 13.3, platelet count 550. INR 0.9.   ASSESSMENT AND PLAN: The patient is a 49 year old Caucasian female with history of recurrent chest pains, who presents with similar type of presentation.   1.  Chest pain, possibly due to unstable angina with known coronary artery disease. At this time, the ER physician has spoken to Dr. Mariah Milling who recommends admission, placing patient on Lovenox. We will continue, Lovenox. We will place her on nitro patch as well. We will check serial cardiac enzymes. Continue aspirin and Plavix as taking previously. Will use morphine as needed for p.r.n. pain.  2.  Leukocytosis, possible reactive. We will check a urinalysis. CT scan of the chest and abdomen showed no acute abnormality or suggested infection.  3.   Hyperlipidemia. Continue Lipitor as taking previously.  4.  Gastroesophageal reflux disease. Will continue proton pump inhibitor with Protonix as taking previously.   NOTE: 45 minutes spent on this H and P.    ____________________________ Lacie Scotts. Allena Katz,  MD shp:nts D: 05/31/2013 19:45:59 ET T: 05/31/2013 22:43:09 ET JOB#: [id number]  cc: Mckay Tegtmeyer H. Allena Katz, MD, <Dictator> Charise Carwin MD ELECTRONICALLY SIGNED 06/01/2013 13:13

## 2015-03-07 NOTE — Consult Note (Signed)
PATIENT NAME:  Kristin Coleman, Kristin Coleman MR#:  [id number] DATE OF BIRTH:  [birth year]  DATE OF CONSULTATION:  03/19/2012  REFERRING PHYSICIAN:  Theodoro Grist, MD with [facility] CONSULTING PHYSICIAN:  Dwayne D. Clayborn Bigness, MD PRIMARY CARE PHYSICIAN: Kerin Perna, MD   INDICATION: Unstable angina, chest pain, known coronary disease.   HISTORY OF PRESENT ILLNESS: Ms. Lasch is a 49 year old white female known to me with history of coronary artery disease, unstable angina, history of angioplasty and stenting, presented with recurrent chest pain and angina. She has not had any in about six months. The pain has been left-sided and persistent, unrelenting with wax and waning. She took some sublingual nitroglycerin with sprays and improved, but the pain came back and she finally came to Emergency Room. She has known coronary disease with angioplasty and stenting of right coronary artery which subsequently had restenosis and has been unable to be treated interventionally and has been treated medically. She was last seen at [facility] back in September-October 2012 but was treated medically at that point.   REVIEW OF SYSTEMS: No blackout spells, syncope. No nausea, vomiting. No fever, no  chills. No sweats. No weight loss, no weight gain. No hemoptysis or hematemesis. No bright red blood per rectum.   PAST MEDICAL HISTORY:  1. Myocardial infarction.  2. Hyperlipidemia.  3. Chronic low back pain. 4. Fibromyalgia.  5. Depression. 6. Reflux. 7. Hypertension. 8. Coronary artery disease. 9. Unstable angina. 10. Chronic pain syndrome  11. Nephrolithiasis.  12. Hiatal hernia.    PAST SURGICAL HISTORY:  1. Nerve stimulator placed in her back in June of 2012.  2. Multiple stents to her right coronary artery.   ALLERGIES: Oxycodone, sulfa, Ranexa, Flexeril, Toradol, amoxicillin, tape, Neurontin, Levaquin.   FAMILY HISTORY: Coronary artery disease, heart attacks.   SOCIAL HISTORY: She quit smoking five years ago.  She lives at home with her husband. No alcohol consumption.   MEDICATIONS:  1. Aspirin 81 mg a day.  2. Carbidopa/levodopa 50/200 extended release, 1 tablet b.i.d.   3. Enalapril 10 mg a day.  4. Famotidine 40 mg b.i.d.  5. Omeprazole 20 mg b.i.d. 6. Crestor 20 mg a day.  7. Maalox q.i.d.  8. MiraLax once a day.  9. Zofran 8 mg t.i.d.  10. Percocet 5/325 q.i.d.  11. Oxycodone. 12. Fentanyl patch.  She was on Toprol but does not seem to take it anymore.    PHYSICAL EXAMINATION:  VITAL SIGNS: Blood pressure 160/80, pulse 95, respiratory rate 16, afebrile.   HEENT: Normocephalic, atraumatic. Pupils are reactive to light.   NECK: Supple. No jugular venous distention or adenopathy.   LUNGS: Clear to auscultation and percussion. No wheezing, rhonchi, or rale.   HEART: Regular rate and rhythm. Positive S4. Soft systolic ejection murmur at left sternal border.  ABDOMEN: Exam benign.   EXTREMITIES: Exam within normal limits.   NEUROLOGICAL: Exam is intact.   SKIN: Exam is normal.   LABORATORY, DIAGNOSTIC AND RADIOLOGICAL DATA:  Liver function tests normal.  MET-B normal.  CBC normal.  EKG: Normal sinus rhythm, rate of 80, nonspecific ST-T wave changes.  Cardiac enzymes and troponin were normal.   ASSESSMENT:  1. Unstable angina. 2. Chest pain. 3. Angina. 4. Coronary artery disease.  5. Hyperlipidemia.  6. Reflux.  7. Depression. 8. Chronic pain syndrome. 9. Fibromyalgia.   PLAN: The patient has persistent pain with known coronary artery disease. We will consider options for further evaluation with a repeat cardiac catheterization or a functional study.  I would probably favor a repeat cardiac catheterization, it has been a while since she has had one done, to re-evaluate her coronary anatomy to be sure there is no further progression of coronary artery disease in her left system and re-evaluate whether the right coronary artery can be treated as a chronic total occlusion  and recannulated. Continue reflux medications. Continue lipid management. Continue chronic pain medications. I do not recommend bypass at this point, but I think further evaluation invasively may be helpful to help re-evaluate her current status.  ____________________________ Loran Senters Clayborn Bigness, MD ddc:cbb D: 03/20/2012 07:02:11 ET T: 03/20/2012 11:48:19 ET JOB#: [id number] DWAYNE D CALLWOOD MD ELECTRONICALLY SIGNED 04/04/2012 11:34

## 2015-03-07 NOTE — H&P (Signed)
PATIENT NAME:  Kristin Coleman, Kristin Coleman MR#:  [id number] DATE OF BIRTH:  07-Jun-1966  DATE OF ADMISSION:  03/19/2012  PRIMARY CARE PHYSICIAN: Rolm Gala, MD   CARDIOLOGIST: Dr. Okey Dupre at [facility] as well as Dr. Juliann Pares   EMERGENCY ROOM PHYSICIAN: The patient was requested to be admitted by Dr. Clemens Catholic   HISTORY OF PRESENT ILLNESS: The patient is a 49 year old Caucasian female with past medical history significant for history of coronary artery disease who presented to the hospital with complaints of left-sided chest pains. According to the patient, she was doing well up until 2 a.m. on day of admission when she was awakened by left-sided chest pain radiating to her jaw. Pain was described as sharp, now it's more pressure-type pain lasting for the past six hours. It is also stabbing into the left shoulder blade. She took some nitroglycerin spray and this pain somewhat stopped, however, came back later on and she was brought to the Emergency Room by her husband.   PAST MEDICAL HISTORY: 1. History of myocardial infarction, multiple coronary stents, at least five stents in the RCA. 2. History of hyperlipidemia.  3. Chronic low back pain. 4. Fibromyalgia. 5. Depression. 6. Gastroesophageal reflux disease. 7. Hypertension. 8. History of coronary artery disease. As mentioned above, the patient had a cardiac catheterization at our facility in August 2011 and she was noted to have 100% RCA occlusion with good collaterals and minor luminal irregularities.  9. Hospital admission for unstable angina in January 2012 and transferred to [facility]. She stated that she may have undergone cardiac catheterization in the past but no intervention was performed and Plavix was stopped as she was noted to have good collaterals and otherwise she had clogged her native arteries per the patient's verbal report. 10. History of hyperlipidemia. 11. History of nephrolithiasis. 12. Hiatal  hernia. 13. History of nerve stimulator placed in the back approximately June 2012.   ALLERGIES: Multiple allergies including oxycodone, however, she is able to take morphine. She also uses fentanyl at home. She is also allergic to sulfa, Ranexa, Flexeril, Toradol, amoxicillin, tape, Neurontin, as well as Levaquin.   MEDICATIONS ACCORDING TO THE PATIENT: 1. Aspirin 81 mg daily. 2. Carbidopa/levodopa 50/200 mg extended-release 1 tablet twice daily. 3. Enalapril 10 mg daily. 4. Famotidine 40 mg p.o. twice daily. 5. Omeprazole 20 mg p.o. twice daily. 6. Crestor 20 mg p.o. daily. 7. Maalox 4 times daily. 8. MiraLAX once daily. 9. Zofran 8 mg p.o. 3 times daily. 10. Percocet 5/325 mg 4 times daily.  11. In the past she was on Toprol-XL, doesn't seem that she is on this medication anymore. She was also on oxycodone and fentanyl patch.  FAMILY HISTORY: Significant for heart disease. The patient's father died of major heart attack in his early 92's. The patient's maternal grandfather also died of myocardial infarction in his 82's. The patient's brother as well as sister have coronary artery disease. Some family history of coronary artery disease.   SOCIAL HISTORY: Former use of tobacco, quit at least five years ago. No alcohol. No illicit drugs. The patient lives at home with her husband.  REVIEW OF SYSTEMS: Positive for pains in her chest bringing on cold sweat, seasonal allergies, shortness of breath with this chest pain, pressure over the chest, achiness in her legs which she attributes to restless leg syndrome. Also, nausea as well as diarrhea for the past 2 or 3 days. Denies any fevers, chills, fatigue, weakness, weight loss or gain. In regards to  eyes, denies any blurry vision, double vision, glaucoma, or cataracts. ENT: Denies any tinnitus, sinus problems, dentures, difficulty swallowing. RESPIRATORY: Denies any cough, wheeze, asthma, COPD. CARDIOVASCULAR: Denies any orthopnea, edema,  arrhythmias, palpitations, or syncope. GASTROINTESTINAL: Denies any vomiting, hematemesis, rectal bleeding, change in bowel habits. GENITOURINARY: Denies dysuria, hematuria, frequency, or incontinence. ENDOCRINOLOGY: Denies any polydipsia, nocturia, thyroid problems, heat or cold intolerance, or thirst. HEMATOLOGIC: Denies anemia, easy bruising, bleeding, swollen glands. SKIN: Denies any acne, rashes, lesions, change in moles. MUSCULOSKELETAL: Denies arthritis, cramps, swelling, gout. NEUROLOGIC: No numbness, epilepsy, or tremor. PSYCHIATRIC: Denies anxiety, insomnia, or depression.   PHYSICAL EXAMINATION:   VITAL SIGNS: On arrival to the hospital, temperature 97.7, pulse 96, respiration rate 20, blood pressure 158/85, saturation 99% on room air.   GENERAL: This is a well developed, well nourished Caucasian female in no significant distress laying on the stretcher.  HEENT: Pupils are equal and reactive to light. Extraocular motion intact. No icterus or conjunctivitis. Has normal hearing. No pharyngeal erythema. Mucosa is moist.   NECK: No masses, supple, nontender. Thyroid not enlarged. No adenopathy. No JVD or carotid bruits bilaterally. Full range of motion.   LUNGS: Clear to auscultation in all fields. No rales, rhonchi, or wheezing. Somewhat diminished breath sounds posteriorly. No labored inspirations or increased effort. No dullness to percussion. Not in overt respiratory distress.   CARDIOVASCULAR: S1, S2 appreciated. No murmurs, rubs, or gallops. PMI not lateralized. Chest is nontender to palpation. 1+ pedal pulses. No lower extremity edema, calf tenderness, or cyanosis.   ABDOMEN: Soft, nontender. Bowel sounds are present. No hepatosplenomegaly or masses were noted.  RECTAL: Deferred.     MUSCULOSKELETAL: Muscle strength able to walk. No cyanosis, degenerative joint disease, or kyphosis. Gait is not tested.   SKIN: Skin did not reveal rashes, lesions, erythema, nodularity, or  induration. It was warm and dry to palpation.   LYMPH: No adenopathy in the cervical region.   NEUROLOGICAL: Cranial nerves grossly intact. Sensory is intact. No dysarthria or aphasia.   PSYCHIATRIC: The patient is alert and oriented to time, person, and place, cooperative. Memory is good. No significant confusion, agitation, or depression noted.   LABORATORY, DIAGNOSTIC, AND RADIOLOGICAL DATA: BMP within normal limits. Liver enzymes were normal. Cardiac enzymes, first set, negative. CBC within normal limits. EKG normal sinus rhythm in 80's, normal axis, no acute ST-T changes were noted.   ASSESSMENT AND PLAN:  1. Chest pain. Admit patient to the medical floor. Start her on metoprolol. We are not able to use nitroglycerin due to intolerance and headaches. Will continue aspirin as well as low dose heparin. Will get cardiologist involved for further recommendations.  2. Suspected COPD with decreased breath sounds posteriorly. Will initiate DuoNebs. Will also give the patient albuterol to be used at home.  3. History of hyperlipidemia. Will continue Crestor. 4. History of hypertension. Will continue the patient on Vasotec as well as will add, as mentioned above, metoprolol. Will follow the patient's blood pressure readings.  5. History of gastroesophageal reflux disease. Will continue omeprazole as well as Maalox.  6. History of constipation. Will continue MiraLAX.  7. Nausea. Will continue Zofran if needed.  TIME SPENT: 50 minutes.   ____________________________ Katharina Caper, MD rv:drc D: 03/19/2012 09:44:47 ET T: 03/19/2012 10:53:26 ET JOB#: [id number]  cc: Katharina Caper, MD, <Dictator> Letitia Caul, MD Charolette Bultman MD ELECTRONICALLY SIGNED 03/23/2012 17:02

## 2015-03-07 NOTE — Discharge Summary (Signed)
PATIENT NAME:  Kristin Coleman, Kristin Coleman MR#:  [id number] DATE OF BIRTH:  27-Oct-1966  DATE OF ADMISSION:  03/19/2012 DATE OF DISCHARGE:  03/20/2012  ADMITTING DIAGNOSIS: Chest pain.   DISCHARGE DIAGNOSES:  1. Chest pain, noncardiac likely, unclear etiology at this time, suspected chronic obstructive pulmonary disease exacerbation, acute bronchitis.  Status post cardiac catheterization on 03/20/2012 by Dr. Juliann Pares. Catheterization site bleeding, resolved.  2. Chronic obstructive pulmonary disease, questionable acute bronchitis.  3. Hyperlipidemia.  4. Hypertension. 5. Restless leg syndrome.  6. Nausea, resolved.   DISCHARGE CONDITION: Stable.   DISCHARGE MEDICATIONS: The patient is to resume her outpatient medications which are:  1. Aspirin 81 mg p.o. daily.  2. Enalapril 10 mg p.o. twice daily. 3. Famotidine 40 mg p.o. twice daily. 4. Metoprolol succinate 50 mg p.o. daily at bedtime. 5. Carbidopa/levodopa 50/200 mg 1 tablet twice daily.  6. Omeprazole 20 mg p.o. twice daily. 7. Crestor 40 mg p.o. at bedtime.   ADDITIONAL MEDICATIONS:  1. Percocet 5/325 milligrams, one tablet every four hours as needed, 15 pills given. No refills. 2. Zithromax 500 mg p.o. once today on 03/20/2012, then 250 mg p.o. daily for four more days. 3. Albuterol inhaler 2 puffs every four hours as needed.   DIET: 2 gram salt, low fat, low cholesterol.   PHYSICAL ACTIVITY LIMITATIONS: As tolerated.   FOLLOWUP: Follow-up appointment with Dr. Rolm Gala in two days after discharge as well as Dr. Juliann Pares in one week after discharge.   CONSULTANT: Dr. Juliann Pares.   RADIOLOGIC STUDIES: Chest x-ray, portable single view, 03/19/2012 showed no acute abnormality. Chest stable since July 2012. Cardiac catheterization results per postoperative brief note showed 100% RCA left side unchanged occlusion. Ejection fraction 55%. Felt that that the patient needs medical therapy. However, did not feel that it was cardiac chest  pain.   HISTORY: The patient is a 49 year old Caucasian female with past medical history significant for history of coronary artery disease, status post numerous stents who presented to the hospital with complaints of left-sided chest pain. Please refer to Dr. Arlys John admission note on 03/19/2012.   PHYSICAL EXAMINATION:  On arrival to the Emergency Room, the patient's vitals showed her temperature was 97.7, pulse 96, respiratory rate 20, blood pressure 158/85, saturation was 99% on room air. Physical exam was unremarkable except for diminished breath sounds posteriorly. Otherwise, not in significant respiratory distress. She did have some discomfort on palpation of her left chest area.   LABORATORY, RADIOLOGICAL AND DIAGNOSTIC DATA: The patient's lab data showed a normal BMP, normal liver enzymes as well as normal cardiac enzymes, first set, as well as subsequent two more sets, normal CBC. EKG showed normal sinus rhythm at 88 beats per minute, normal axis, no significant ST-T changes were noted. The patient's chest x-ray was unremarkable.   HOSPITAL COURSE: The patient was admitted to the hospital. She was continued on metoprolol, aspirin and nitroglycerin as well as heparin subcutaneous. Cardiology consultation was requested. Cardiology felt that the patient's presentation could be related to unstable angina and cardiac catheterization was recommended. The patient underwent cardiac catheterization on 03/20/2012. It revealed the same occlusion of the right coronary artery 100%, but the left side was unchanged from previous. Dr. Juliann Pares felt that medical therapy should be continued and he felt that the patient's chest pain very unlikely was cardiac. Post procedure, the patient did not have any significant discomfort except the patient's cath site started bleeding requiring occlusion for a few minutes The patient is being discharged home as  soon as her bleeding stopped and was deemed to be safe to be  discharged. The patient is to continue current medications for therapy of her coronary artery disease. She is to follow up with her primary care physician and make decisions about therapy of her noncardiac chest pain. Meanwhile, the patient was given 15 pills of Percocet. It was felt that part of the patient's chest pain could be explained by her chronic obstructive pulmonary disease, acute bronchitis. The patient was noted to have diminished breath sounds on admission, however, and required inhalation therapy. After inhalation therapy, she developed some cough with dark sputum production and felt that she could have bronchitis, so Zithromax was prescribed for her as well as albuterol inhaler for home. The patient is to follow-up with Dr. Rolm Gala and make decisions about therapy of her chronic obstructive pulmonary disease. In regards to hypertension, hyperlipidemia, as well as restless leg syndrome, the patient is to continue outpatient medications. The patient's nausea, which she was complaining of on admission to the hospital, resolved. The patient is being discharged in stable condition with the above-mentioned medications and follow-up.   VITAL SIGNS ON THE DAY OF DISCHARGE: Temperature 98.2, pulse ranging from 50s to 60s, respiratory rate 18 to 20, blood pressure 149/88, saturation 99% on room air at rest.   TIME SPENT: 40 minutes.    ____________________________ Katharina Caper, MD rv:ap D: 03/20/2012 21:17:44 ET T: 03/21/2012 13:19:34 ET JOB#: [id number]  cc: Katharina Caper, MD, <Dictator> Letitia Caul, MD Preesha Benjamin MD ELECTRONICALLY SIGNED 03/23/2012 17:02

## 2015-03-14 NOTE — Op Note (Signed)
PATIENT NAME:  Kristin Coleman, Kristin Coleman MR#:  [id number] DATE OF BIRTH:  01/12/1966  DATE OF PROCEDURE:  12/17/2013  PREOPERATIVE DIAGNOSIS: Cardiac arrest.   POSTOPERATIVE DIAGNOSIS: Cardiac arrest.  OPERATION PERFORMED: Left subclavian vein central venous catheter placement.   SURGEON: Duwaine Maxin, MD.  ANESTHESIA: Local.  PROCEDURE IN DETAIL: A triple lumen left subclavian vein central venous catheter was placed utilizing the double Seldinger technique after the patient was prepped and draped sterilely. The catheter was secured to the skin with the provided 3-0 silk and sterilely dressed and flushed with heparinized saline. Central venous pressure was essentially normal. Postoperative portable chest x-ray was normal and showed good position with the tip being close to the superior vena cava right atrial junction and no evidence of pneumothorax.    ____________________________ Claude Manges, MD wfm:aw D: 12/22/2013 06:59:23 ET T: 12/22/2013 07:12:30 ET JOB#: [id number]  cc: Claude Manges, MD, <Dictator> Claude Manges MD ELECTRONICALLY SIGNED 01/02/2014 0:11
